# Patient Record
Sex: Female | Born: 1960
Health system: Southern US, Community
[De-identification: ages and names within clinical notes are randomized; demographics above are authoritative.]

## PROBLEM LIST (undated history)

## (undated) DIAGNOSIS — J45909 Unspecified asthma, uncomplicated: Secondary | ICD-10-CM

## (undated) DIAGNOSIS — T8859XA Other complications of anesthesia, initial encounter: Secondary | ICD-10-CM

## (undated) DIAGNOSIS — I10 Essential (primary) hypertension: Secondary | ICD-10-CM

## (undated) DIAGNOSIS — E785 Hyperlipidemia, unspecified: Secondary | ICD-10-CM

## (undated) DIAGNOSIS — E119 Type 2 diabetes mellitus without complications: Secondary | ICD-10-CM

## (undated) DIAGNOSIS — D649 Anemia, unspecified: Secondary | ICD-10-CM

## (undated) HISTORY — PX: CSF SHUNT: SHX92

---

## 2004-01-22 ENCOUNTER — Encounter: Admission: RE | Admit: 2004-01-22 | Discharge: 2004-01-22 | Payer: Self-pay | Admitting: Obstetrics and Gynecology

## 2005-06-19 ENCOUNTER — Encounter: Admission: RE | Admit: 2005-06-19 | Discharge: 2005-06-19 | Payer: Self-pay | Admitting: Obstetrics and Gynecology

## 2006-06-28 ENCOUNTER — Encounter: Admission: RE | Admit: 2006-06-28 | Discharge: 2006-06-28 | Payer: Self-pay | Admitting: Obstetrics and Gynecology

## 2007-07-01 ENCOUNTER — Encounter: Admission: RE | Admit: 2007-07-01 | Discharge: 2007-07-01 | Payer: Self-pay | Admitting: Obstetrics and Gynecology

## 2008-09-05 ENCOUNTER — Emergency Department (HOSPITAL_COMMUNITY): Admission: EM | Admit: 2008-09-05 | Discharge: 2008-09-05 | Payer: Self-pay | Admitting: Emergency Medicine

## 2009-08-08 ENCOUNTER — Emergency Department (HOSPITAL_COMMUNITY): Admission: EM | Admit: 2009-08-08 | Discharge: 2009-08-08 | Payer: Self-pay | Admitting: Family Medicine

## 2011-03-21 LAB — POCT URINALYSIS DIP (DEVICE)
Nitrite: NEGATIVE
Specific Gravity, Urine: 1.025 (ref 1.005–1.030)
pH: 5.5 (ref 5.0–8.0)

## 2011-03-21 LAB — WET PREP, GENITAL: Yeast Wet Prep HPF POC: NONE SEEN

## 2011-06-03 ENCOUNTER — Emergency Department (HOSPITAL_BASED_OUTPATIENT_CLINIC_OR_DEPARTMENT_OTHER)
Admission: EM | Admit: 2011-06-03 | Discharge: 2011-06-03 | Disposition: A | Discharge status: Home / Self Care | Payer: Self-pay | Attending: Emergency Medicine | Admitting: Emergency Medicine

## 2011-06-03 DIAGNOSIS — R062 Wheezing: Secondary | ICD-10-CM | POA: Insufficient documentation

## 2011-06-03 DIAGNOSIS — E785 Hyperlipidemia, unspecified: Secondary | ICD-10-CM | POA: Insufficient documentation

## 2011-06-03 DIAGNOSIS — R059 Cough, unspecified: Secondary | ICD-10-CM | POA: Insufficient documentation

## 2011-06-03 DIAGNOSIS — I10 Essential (primary) hypertension: Secondary | ICD-10-CM | POA: Insufficient documentation

## 2011-06-03 DIAGNOSIS — E119 Type 2 diabetes mellitus without complications: Secondary | ICD-10-CM | POA: Insufficient documentation

## 2011-06-03 DIAGNOSIS — R05 Cough: Secondary | ICD-10-CM | POA: Insufficient documentation

## 2011-09-14 LAB — POCT URINALYSIS DIP (DEVICE)
Bilirubin Urine: NEGATIVE
Hgb urine dipstick: NEGATIVE
Nitrite: NEGATIVE
Protein, ur: NEGATIVE
Urobilinogen, UA: 0.2
pH: 7.5

## 2011-09-14 LAB — POCT PREGNANCY, URINE: Preg Test, Ur: NEGATIVE

## 2013-03-24 ENCOUNTER — Other Ambulatory Visit (HOSPITAL_COMMUNITY)
Admission: RE | Admit: 2013-03-24 | Discharge: 2013-03-24 | Disposition: A | Discharge status: Home / Self Care | Payer: BC Managed Care – PPO | Source: Ambulatory Visit | Attending: Family Medicine | Admitting: Family Medicine

## 2013-03-24 DIAGNOSIS — Z01419 Encounter for gynecological examination (general) (routine) without abnormal findings: Secondary | ICD-10-CM | POA: Insufficient documentation

## 2013-03-24 DIAGNOSIS — Z1151 Encounter for screening for human papillomavirus (HPV): Secondary | ICD-10-CM | POA: Insufficient documentation

## 2013-07-24 ENCOUNTER — Other Ambulatory Visit (HOSPITAL_COMMUNITY)
Admission: RE | Admit: 2013-07-24 | Discharge: 2013-07-24 | Disposition: A | Discharge status: Home / Self Care | Payer: BC Managed Care – PPO | Source: Ambulatory Visit | Attending: Family Medicine | Admitting: Family Medicine

## 2013-07-24 DIAGNOSIS — Z113 Encounter for screening for infections with a predominantly sexual mode of transmission: Secondary | ICD-10-CM | POA: Insufficient documentation

## 2013-07-24 DIAGNOSIS — N760 Acute vaginitis: Secondary | ICD-10-CM | POA: Insufficient documentation

## 2014-07-27 ENCOUNTER — Other Ambulatory Visit (HOSPITAL_COMMUNITY)
Admission: RE | Admit: 2014-07-27 | Discharge: 2014-07-27 | Disposition: A | Discharge status: Home / Self Care | Payer: No Typology Code available for payment source | Source: Ambulatory Visit | Attending: Family Medicine | Admitting: Family Medicine

## 2014-07-27 DIAGNOSIS — Z1151 Encounter for screening for human papillomavirus (HPV): Secondary | ICD-10-CM | POA: Diagnosis present

## 2014-07-27 DIAGNOSIS — Z01419 Encounter for gynecological examination (general) (routine) without abnormal findings: Secondary | ICD-10-CM | POA: Insufficient documentation

## 2016-01-05 ENCOUNTER — Encounter (HOSPITAL_BASED_OUTPATIENT_CLINIC_OR_DEPARTMENT_OTHER): Payer: Self-pay | Admitting: *Deleted

## 2016-01-05 ENCOUNTER — Emergency Department (HOSPITAL_BASED_OUTPATIENT_CLINIC_OR_DEPARTMENT_OTHER)
Admission: EM | Admit: 2016-01-05 | Discharge: 2016-01-05 | Disposition: A | Discharge status: Home / Self Care | Payer: BLUE CROSS/BLUE SHIELD | Attending: Emergency Medicine | Admitting: Emergency Medicine

## 2016-01-05 ENCOUNTER — Emergency Department (HOSPITAL_BASED_OUTPATIENT_CLINIC_OR_DEPARTMENT_OTHER): Payer: BLUE CROSS/BLUE SHIELD

## 2016-01-05 DIAGNOSIS — J069 Acute upper respiratory infection, unspecified: Secondary | ICD-10-CM

## 2016-01-05 DIAGNOSIS — Z880 Allergy status to penicillin: Secondary | ICD-10-CM | POA: Insufficient documentation

## 2016-01-05 DIAGNOSIS — Z7984 Long term (current) use of oral hypoglycemic drugs: Secondary | ICD-10-CM | POA: Insufficient documentation

## 2016-01-05 DIAGNOSIS — E119 Type 2 diabetes mellitus without complications: Secondary | ICD-10-CM | POA: Insufficient documentation

## 2016-01-05 DIAGNOSIS — E785 Hyperlipidemia, unspecified: Secondary | ICD-10-CM | POA: Insufficient documentation

## 2016-01-05 DIAGNOSIS — I10 Essential (primary) hypertension: Secondary | ICD-10-CM | POA: Insufficient documentation

## 2016-01-05 DIAGNOSIS — Z79899 Other long term (current) drug therapy: Secondary | ICD-10-CM | POA: Insufficient documentation

## 2016-01-05 HISTORY — DX: Type 2 diabetes mellitus without complications: E11.9

## 2016-01-05 HISTORY — DX: Hyperlipidemia, unspecified: E78.5

## 2016-01-05 HISTORY — DX: Essential (primary) hypertension: I10

## 2016-01-05 MED ORDER — BENZONATATE 100 MG PO CAPS: 100.0000 mg | ORAL_CAPSULE | Freq: Three times a day (TID) | ORAL | Status: DC | PRN

## 2016-01-05 MED ORDER — BENZONATATE 100 MG PO CAPS
100.0000 mg | ORAL_CAPSULE | Freq: Once | ORAL | Status: AC
  Administered 2016-01-05: 100 mg via ORAL
  Filled 2016-01-05: qty 1

## 2016-01-05 MED ORDER — ALBUTEROL SULFATE HFA 108 (90 BASE) MCG/ACT IN AERS
2.0000 | INHALATION_SPRAY | Freq: Once | RESPIRATORY_TRACT | Status: AC
  Administered 2016-01-05: 2 via RESPIRATORY_TRACT
  Filled 2016-01-05: qty 6.7

## 2016-01-05 NOTE — ED Provider Notes (Signed)
CSN: [id number]     Arrival date & time 01/05/16  1015 History   First MD Initiated Contact with Patient 01/05/16 1032     Chief Complaint  Patient presents with  . Nasal Congestion     (Consider location/radiation/quality/duration/timing/severity/associated sxs/prior Treatment) Patient is a 55 y.o. female presenting with URI.  URI Presenting symptoms: cough (productive yellow) and sore throat   Presenting symptoms: no congestion, no fever and no rhinorrhea   Severity:  Severe Duration:  2 weeks Timing:  Constant Progression:  Worsening Chronicity:  New Relieved by: mucinex, breaking it up, but still tehre. Worsened by:  Nothing tried Ineffective treatments: mucinex. Associated symptoms: wheezing   Associated symptoms: no headaches and no neck pain     Past Medical History  Diagnosis Date  . Hypertension   . Hyperlipidemia   . Diabetes mellitus without complication [facility])    Past Surgical History  Procedure Laterality Date  . Csf shunt      states had shunt for her brain 17 yrs ago   No family history on file. Social History  Substance Use Topics  . Smoking status: Never Smoker   . Smokeless tobacco: None  . Alcohol Use: No   OB History    No data available     Review of Systems  Constitutional: Negative for fever.  HENT: Positive for sore throat. Negative for congestion and rhinorrhea.   Eyes: Negative for visual disturbance.  Respiratory: Positive for cough (productive yellow) and wheezing. Negative for shortness of breath.   Cardiovascular: Negative for chest pain.  Gastrointestinal: Negative for nausea, vomiting and abdominal pain.  Genitourinary: Negative for difficulty urinating.  Musculoskeletal: Negative for back pain and neck pain.  Skin: Negative for rash.  Neurological: Negative for syncope and headaches.      Allergies  Penicillins  Home Medications   Prior to Admission medications   Medication Sig Start Date End Date Taking? Authorizing  Provider  amLODipine (NORVASC) 5 MG tablet Take 5 mg by mouth daily.   Yes Historical Provider, MD  fluvastatin (LESCOL) 20 MG capsule Take 20 mg by mouth at bedtime.   Yes Historical Provider, MD  losartan-hydrochlorothiazide (HYZAAR) 100-12.5 MG tablet Take 1 tablet by mouth daily.   Yes Historical Provider, MD  metFORMIN (GLUCOPHAGE) 500 MG tablet Take by mouth 2 (two) times daily with a meal.   Yes Historical Provider, MD   BP 167/75 mmHg  Pulse 98  Temp(Src) 98.3 F (36.8 C) (Oral)  Resp 20  Ht 5' (1.524 m)  Wt 147 lb (66.679 kg)  BMI 28.71 kg/m2  SpO2 98% Physical Exam  Constitutional: She is oriented to person, place, and time. She appears well-developed and well-nourished. No distress.  HENT:  Head: Normocephalic and atraumatic.  Eyes: Conjunctivae and EOM are normal.  Neck: Normal range of motion.  Cardiovascular: Normal rate, regular rhythm, normal heart sounds and intact distal pulses.  Exam reveals no gallop and no friction rub.   No murmur heard. Pulmonary/Chest: Effort normal and breath sounds normal. No respiratory distress. She has no wheezes. She has no rales.  Diminished throughout  Abdominal: Soft. She exhibits no distension. There is no tenderness. There is no guarding.  Musculoskeletal: She exhibits no edema or tenderness.  Neurological: She is alert and oriented to person, place, and time.  Skin: Skin is warm and dry. No rash noted. She is not diaphoretic. No erythema.  Nursing note and vitals reviewed.   ED Course  Procedures (including critical care time)  Labs Review Labs Reviewed - No data to display  Imaging Review No results found. I have personally reviewed and evaluated these images and lab results as part of my medical decision-making.   EKG Interpretation None      MDM   Final diagnoses:  None   55yo female with history of htn hlpd, DM presents with concern for cough and sore throat. XR shows no sign of pneumonia. No sign  otitis/sinusitis.  Afebrile, well appearing, no shortness of breath.  Patient with peristent cough and some family hx of asthma and will trial albuterol for cough.  Given tessalon perles with improvement. Discharged with rx for tessalon and albuterol inhaler. Patient discharged in stable condition with understanding of reasons to return.     Alvira Monday, MD 01/05/16 2340

## 2016-01-05 NOTE — ED Notes (Signed)
Patient transported to X-ray 

## 2016-01-05 NOTE — Discharge Instructions (Signed)
Use 2 puffs of albuterol every 4 hours as needed for cough.  Upper Respiratory Infection, Adult Most upper respiratory infections (URIs) are a viral infection of the air passages leading to the lungs. A URI affects the nose, throat, and upper air passages. The most common type of URI is nasopharyngitis and is typically referred to as "the common cold." URIs run their course and usually go away on their own. Most of the time, a URI does not require medical attention, but sometimes a bacterial infection in the upper airways can follow a viral infection. This is called a secondary infection. Sinus and middle ear infections are common types of secondary upper respiratory infections. Bacterial pneumonia can also complicate a URI. A URI can worsen asthma and chronic obstructive pulmonary disease (COPD). Sometimes, these complications can require emergency medical care and may be life threatening.  CAUSES Almost all URIs are caused by viruses. A virus is a type of germ and can spread from one person to another.  RISKS FACTORS You may be at risk for a URI if:   You smoke.   You have chronic heart or lung disease.  You have a weakened defense (immune) system.   You are very young or very old.   You have nasal allergies or asthma.  You work in crowded or poorly ventilated areas.  You work in health care facilities or schools. SIGNS AND SYMPTOMS  Symptoms typically develop 2-3 days after you come in contact with a cold virus. Most viral URIs last 7-10 days. However, viral URIs from the influenza virus (flu virus) can last 14-18 days and are typically more severe. Symptoms may include:   Runny or stuffy (congested) nose.   Sneezing.   Cough.   Sore throat.   Headache.   Fatigue.   Fever.   Loss of appetite.   Pain in your forehead, behind your eyes, and over your cheekbones (sinus pain).  Muscle aches.  DIAGNOSIS  Your health care provider may diagnose a URI  by:  Physical exam.  Tests to check that your symptoms are not due to another condition such as:  Strep throat.  Sinusitis.  Pneumonia.  Asthma. TREATMENT  A URI goes away on its own with time. It cannot be cured with medicines, but medicines may be prescribed or recommended to relieve symptoms. Medicines may help:  Reduce your fever.  Reduce your cough.  Relieve nasal congestion. HOME CARE INSTRUCTIONS   Take medicines only as directed by your health care provider.   Gargle warm saltwater or take cough drops to comfort your throat as directed by your health care provider.  Use a warm mist humidifier or inhale steam from a shower to increase air moisture. This may make it easier to breathe.  Drink enough fluid to keep your urine clear or pale yellow.   Eat soups and other clear broths and maintain good nutrition.   Rest as needed.   Return to work when your temperature has returned to normal or as your health care provider advises. You may need to stay home longer to avoid infecting others. You can also use a face mask and careful hand washing to prevent spread of the virus.  Increase the usage of your inhaler if you have asthma.   Do not use any tobacco products, including cigarettes, chewing tobacco, or electronic cigarettes. If you need help quitting, ask your health care provider. PREVENTION  The best way to protect yourself from getting a cold is to practice  good hygiene.   Avoid oral or hand contact with people with cold symptoms.   Wash your hands often if contact occurs.  There is no clear evidence that vitamin C, vitamin E, echinacea, or exercise reduces the chance of developing a cold. However, it is always recommended to get plenty of rest, exercise, and practice good nutrition.  SEEK MEDICAL CARE IF:   You are getting worse rather than better.   Your symptoms are not controlled by medicine.   You have chills.  You have worsening shortness of  breath.  You have brown or red mucus.  You have yellow or brown nasal discharge.  You have pain in your face, especially when you bend forward.  You have a fever.  You have swollen neck glands.  You have pain while swallowing.  You have white areas in the back of your throat. SEEK IMMEDIATE MEDICAL CARE IF:   You have severe or persistent:  Headache.  Ear pain.  Sinus pain.  Chest pain.  You have chronic lung disease and any of the following:  Wheezing.  Prolonged cough.  Coughing up blood.  A change in your usual mucus.  You have a stiff neck.  You have changes in your:  Vision.  Hearing.  Thinking.  Mood. MAKE SURE YOU:   Understand these instructions.  Will watch your condition.  Will get help right away if you are not doing well or get worse.   This information is not intended to replace advice given to you by your health care provider. Make sure you discuss any questions you have with your health care provider.   Document Released: 05/26/2001 Document Revised: 04/16/2015 Document Reviewed: 03/07/2014 Elsevier Interactive Patient Education [employer]

## 2016-01-05 NOTE — ED Notes (Signed)
DC instructions and Rx reviewed with pt, MD also at bedside to discuss and provide teaching re: following up with MD, need to return to ED and increasing PO fluids.

## 2016-01-05 NOTE — ED Notes (Signed)
Sates for the past couple of weeks has had cough and congestion, has tried OTC meds without relief

## 2016-03-09 ENCOUNTER — Emergency Department (HOSPITAL_BASED_OUTPATIENT_CLINIC_OR_DEPARTMENT_OTHER): Payer: BLUE CROSS/BLUE SHIELD

## 2016-03-09 ENCOUNTER — Encounter (HOSPITAL_BASED_OUTPATIENT_CLINIC_OR_DEPARTMENT_OTHER): Payer: Self-pay | Admitting: Emergency Medicine

## 2016-03-09 ENCOUNTER — Emergency Department (HOSPITAL_BASED_OUTPATIENT_CLINIC_OR_DEPARTMENT_OTHER)
Admission: EM | Admit: 2016-03-09 | Discharge: 2016-03-09 | Disposition: A | Discharge status: Home / Self Care | Payer: BLUE CROSS/BLUE SHIELD | Attending: Emergency Medicine | Admitting: Emergency Medicine

## 2016-03-09 DIAGNOSIS — R0981 Nasal congestion: Secondary | ICD-10-CM | POA: Insufficient documentation

## 2016-03-09 DIAGNOSIS — R067 Sneezing: Secondary | ICD-10-CM | POA: Diagnosis not present

## 2016-03-09 DIAGNOSIS — I10 Essential (primary) hypertension: Secondary | ICD-10-CM | POA: Diagnosis not present

## 2016-03-09 DIAGNOSIS — J3489 Other specified disorders of nose and nasal sinuses: Secondary | ICD-10-CM | POA: Insufficient documentation

## 2016-03-09 DIAGNOSIS — Z7984 Long term (current) use of oral hypoglycemic drugs: Secondary | ICD-10-CM | POA: Diagnosis not present

## 2016-03-09 DIAGNOSIS — R05 Cough: Secondary | ICD-10-CM | POA: Insufficient documentation

## 2016-03-09 DIAGNOSIS — Z880 Allergy status to penicillin: Secondary | ICD-10-CM | POA: Insufficient documentation

## 2016-03-09 DIAGNOSIS — R0602 Shortness of breath: Secondary | ICD-10-CM | POA: Diagnosis not present

## 2016-03-09 DIAGNOSIS — Z79899 Other long term (current) drug therapy: Secondary | ICD-10-CM | POA: Insufficient documentation

## 2016-03-09 DIAGNOSIS — E785 Hyperlipidemia, unspecified: Secondary | ICD-10-CM | POA: Diagnosis not present

## 2016-03-09 DIAGNOSIS — R062 Wheezing: Secondary | ICD-10-CM | POA: Diagnosis present

## 2016-03-09 DIAGNOSIS — E119 Type 2 diabetes mellitus without complications: Secondary | ICD-10-CM | POA: Diagnosis not present

## 2016-03-09 MED ORDER — CETIRIZINE-PSEUDOEPHEDRINE ER 5-120 MG PO TB12: 1.0000 | ORAL_TABLET | Freq: Every day | ORAL | Status: DC

## 2016-03-09 MED ORDER — HYDROCODONE-HOMATROPINE 5-1.5 MG/5ML PO SYRP: 5.0000 mL | ORAL_SOLUTION | Freq: Four times a day (QID) | ORAL | Status: DC | PRN

## 2016-03-09 MED ORDER — IPRATROPIUM-ALBUTEROL 0.5-2.5 (3) MG/3ML IN SOLN
3.0000 mL | RESPIRATORY_TRACT | Status: DC
  Administered 2016-03-09: 3 mL via RESPIRATORY_TRACT
  Filled 2016-03-09: qty 3

## 2016-03-09 MED ORDER — PREDNISONE 50 MG PO TABS
60.0000 mg | ORAL_TABLET | Freq: Once | ORAL | Status: AC
  Administered 2016-03-09: 60 mg via ORAL
  Filled 2016-03-09: qty 1

## 2016-03-09 MED ORDER — PREDNISONE 20 MG PO TABS: 40.0000 mg | ORAL_TABLET | Freq: Every day | ORAL | Status: DC

## 2016-03-09 MED ORDER — AZITHROMYCIN 250 MG PO TABS
500.0000 mg | ORAL_TABLET | Freq: Once | ORAL | Status: AC
  Administered 2016-03-09: 500 mg via ORAL
  Filled 2016-03-09: qty 2

## 2016-03-09 MED ORDER — AZITHROMYCIN 250 MG PO TABS: ORAL_TABLET | ORAL | Status: DC

## 2016-03-09 MED FILL — AZITHROMYCIN 250 MG TABLET: 250 | 4 days supply | Qty: 4 | Fill #0

## 2016-03-09 MED FILL — HYDROCODONE-HOMATROPINE SYR: 5-1.5 | 6 days supply | Qty: 120 | Fill #0

## 2016-03-09 MED FILL — predniSONE 20 MG TABS: 20 | 4 days supply | Qty: 8 | Fill #0

## 2016-03-09 NOTE — ED Notes (Addendum)
Pt seen in January for uri.  Pt states she is still having chest congestion, wheezing but no productive cough.  No fever.  Pt feel sob with supine position but no peripheral edema.

## 2016-03-09 NOTE — ED Provider Notes (Signed)
CSN: [id number]     Arrival date & time 03/09/16  9147 History   First MD Initiated Contact with Patient 03/09/16 [phone]     Chief Complaint  Patient presents with  . Wheezing     (Consider location/radiation/quality/duration/timing/severity/associated sxs/prior Treatment) Patient is a 55 y.o. female presenting with cough.  Cough Cough characteristics:  Productive Sputum characteristics:  Nondescript Severity:  Mild Timing:  Constant Progression:  Waxing and waning Chronicity:  New Smoker: no   Associated symptoms: rhinorrhea, shortness of breath and wheezing   Associated symptoms: no chest pain and no fever     Past Medical History  Diagnosis Date  . Hypertension   . Hyperlipidemia   . Diabetes mellitus without complication [facility])    Past Surgical History  Procedure Laterality Date  . Csf shunt      states had shunt for her brain 17 yrs ago   No family history on file. Social History  Substance Use Topics  . Smoking status: Never Smoker   . Smokeless tobacco: None  . Alcohol Use: No   OB History    No data available     Review of Systems  Constitutional: Negative for fever and fatigue.  HENT: Positive for congestion, rhinorrhea and sneezing. Negative for facial swelling, mouth sores, sinus pressure and trouble swallowing.   Eyes: Negative for pain.  Respiratory: Positive for cough, shortness of breath and wheezing.   Cardiovascular: Negative for chest pain, palpitations and leg swelling.  Endocrine: Negative for polydipsia and polyuria.  All other systems reviewed and are negative.     Allergies  Penicillins  Home Medications   Prior to Admission medications   Medication Sig Start Date End Date Taking? Authorizing Provider  amLODipine (NORVASC) 5 MG tablet Take 5 mg by mouth daily.    Historical Provider, MD  azithromycin (ZITHROMAX) 250 MG tablet One daily for four days 03/10/16   Marily Memos, MD  benzonatate (TESSALON) 100 MG capsule Take 1 capsule  (100 mg total) by mouth 3 (three) times daily as needed for cough. 01/05/16   Alvira Monday, MD  cetirizine-pseudoephedrine (ZYRTEC-D) 5-120 MG tablet Take 1 tablet by mouth daily. 03/09/16   Marily Memos, MD  fluvastatin (LESCOL) 20 MG capsule Take 20 mg by mouth at bedtime.    Historical Provider, MD  HYDROcodone-homatropine (HYCODAN) 5-1.5 MG/5ML syrup Take 5 mLs by mouth every 6 (six) hours as needed for cough. 03/09/16   Marily Memos, MD  losartan-hydrochlorothiazide (HYZAAR) 100-12.5 MG tablet Take 1 tablet by mouth daily.    Historical Provider, MD  metFORMIN (GLUCOPHAGE) 500 MG tablet Take by mouth 2 (two) times daily with a meal.    Historical Provider, MD  predniSONE (DELTASONE) 20 MG tablet Take 2 tablets (40 mg total) by mouth daily with breakfast. 03/10/16   Marily Memos, MD   BP 152/72 mmHg  Pulse 88  Temp(Src) 98.9 F (37.2 C) (Oral)  Resp 16  Ht 5' (1.524 m)  Wt 140 lb (63.504 kg)  BMI 27.34 kg/m2  SpO2 100%  LMP  (LMP Unknown) Physical Exam  Constitutional: She is oriented to person, place, and time. She appears well-developed and well-nourished.  HENT:  Head: Normocephalic and atraumatic.  Neck: Normal range of motion.  Cardiovascular: Normal rate and regular rhythm.   Pulmonary/Chest: Effort normal. No stridor. No respiratory distress. She has wheezes.  Abdominal: Soft. She exhibits no distension. There is no tenderness.  Musculoskeletal: Normal range of motion. She exhibits no edema or tenderness.  Neurological:  She is alert and oriented to person, place, and time.  Skin: Skin is warm and dry. No rash noted.  Nursing note and vitals reviewed.   ED Course  Procedures (including critical care time) Labs Review Labs Reviewed - No data to display  Imaging Review Dg Chest 2 View  03/09/2016  CLINICAL DATA:  Shortness of breath and wheezing EXAM: CHEST  2 VIEW COMPARISON:  January 05, 2016 FINDINGS: Lungs are clear. Heart size and pulmonary vascularity are normal.  No adenopathy. There is a shunt along the anterior right hemithorax. There is degenerative change in the thoracic spine. IMPRESSION: No edema or consolidation. Electronically Signed   By: Bretta BangWilliam  Woodruff III M.D.   On: 03/09/2016 10:43   I have personally reviewed and evaluated these images and lab results as part of my medical decision-making.   EKG Interpretation   Date/Time:  Monday March 09 2016 09:51:24 EDT Ventricular Rate:  79 PR Interval:  200 QRS Duration: 74 QT Interval:  370 QTC Calculation: 424 R Axis:   41 Text Interpretation:  Normal sinus rhythm Nonspecific T wave abnormality  Abnormal ECG Confirmed by Zyra Parrillo MD, Barbara CowerJASON [phone]([id number]) on 03/09/2016 10:43:16  AM      MDM   Final diagnoses:  Wheezing   Likely recurrent URI, improved with albuterol. Has h/o DM, HTN, HLD so will check CXR and ECG as screening cardiac abnormalities/pneumonia. If negative will dc on abx, steroids and inhaler.   Labs and imaging unremarkable. Improved symptoms. Will dc on abx/steroids/inhaler.   New Prescriptions: Discharge Medication List as of 03/09/2016 10:48 AM    START taking these medications   Details  azithromycin (ZITHROMAX) 250 MG tablet One daily for four days, Print    cetirizine-pseudoephedrine (ZYRTEC-D) 5-120 MG tablet Take 1 tablet by mouth daily., Starting 03/09/2016, Until Discontinued, Print    HYDROcodone-homatropine (HYCODAN) 5-1.5 MG/5ML syrup Take 5 mLs by mouth every 6 (six) hours as needed for cough., Starting 03/09/2016, Until Discontinued, Print    predniSONE (DELTASONE) 20 MG tablet Take 2 tablets (40 mg total) by mouth daily with breakfast., Starting 03/10/2016, Until Discontinued, Print         I have personally and contemperaneously reviewed labs and imaging and used in my decision making as above.   A medical screening exam was performed and I feel the patient has had an appropriate workup for their chief complaint at this time and likelihood of emergent  condition existing is low. Their vital signs are stable. They have been counseled on decision, discharge, follow up and which symptoms necessitate immediate return to the emergency department.  They verbally stated understanding and agreement with plan and discharged in stable condition.      Marily MemosJason Kamron Vanwyhe, MD 03/10/16 [phone]

## 2016-03-09 NOTE — ED Notes (Signed)
MD at bedside. 

## 2016-03-09 NOTE — ED Notes (Signed)
Pt directed to pharmacy to pick medications

## 2016-04-13 DIAGNOSIS — I10 Essential (primary) hypertension: Secondary | ICD-10-CM | POA: Diagnosis not present

## 2016-04-13 DIAGNOSIS — E119 Type 2 diabetes mellitus without complications: Secondary | ICD-10-CM | POA: Diagnosis not present

## 2016-04-13 DIAGNOSIS — J309 Allergic rhinitis, unspecified: Secondary | ICD-10-CM | POA: Diagnosis not present

## 2016-07-13 DIAGNOSIS — E119 Type 2 diabetes mellitus without complications: Secondary | ICD-10-CM | POA: Diagnosis not present

## 2016-07-13 DIAGNOSIS — I10 Essential (primary) hypertension: Secondary | ICD-10-CM | POA: Diagnosis not present

## 2016-07-13 DIAGNOSIS — Z6825 Body mass index (BMI) 25.0-25.9, adult: Secondary | ICD-10-CM | POA: Diagnosis not present

## 2016-07-13 DIAGNOSIS — E785 Hyperlipidemia, unspecified: Secondary | ICD-10-CM | POA: Diagnosis not present

## 2016-07-30 DIAGNOSIS — H538 Other visual disturbances: Secondary | ICD-10-CM | POA: Diagnosis not present

## 2016-07-30 DIAGNOSIS — H20011 Primary iridocyclitis, right eye: Secondary | ICD-10-CM | POA: Diagnosis not present

## 2016-07-30 DIAGNOSIS — H35033 Hypertensive retinopathy, bilateral: Secondary | ICD-10-CM | POA: Diagnosis not present

## 2016-10-12 DIAGNOSIS — M25562 Pain in left knee: Secondary | ICD-10-CM | POA: Diagnosis not present

## 2016-10-12 DIAGNOSIS — E785 Hyperlipidemia, unspecified: Secondary | ICD-10-CM | POA: Diagnosis not present

## 2016-10-12 DIAGNOSIS — E118 Type 2 diabetes mellitus with unspecified complications: Secondary | ICD-10-CM | POA: Diagnosis not present

## 2016-11-11 DIAGNOSIS — Z6826 Body mass index (BMI) 26.0-26.9, adult: Secondary | ICD-10-CM | POA: Diagnosis not present

## 2016-11-11 DIAGNOSIS — M659 Synovitis and tenosynovitis, unspecified: Secondary | ICD-10-CM | POA: Diagnosis not present

## 2017-01-11 DIAGNOSIS — I10 Essential (primary) hypertension: Secondary | ICD-10-CM | POA: Diagnosis not present

## 2017-01-11 DIAGNOSIS — E118 Type 2 diabetes mellitus with unspecified complications: Secondary | ICD-10-CM | POA: Diagnosis not present

## 2017-01-11 DIAGNOSIS — M25569 Pain in unspecified knee: Secondary | ICD-10-CM | POA: Diagnosis not present

## 2017-02-08 DIAGNOSIS — Z803 Family history of malignant neoplasm of breast: Secondary | ICD-10-CM | POA: Diagnosis not present

## 2017-02-08 DIAGNOSIS — Z1231 Encounter for screening mammogram for malignant neoplasm of breast: Secondary | ICD-10-CM | POA: Diagnosis not present

## 2017-03-03 DIAGNOSIS — Z6826 Body mass index (BMI) 26.0-26.9, adult: Secondary | ICD-10-CM | POA: Diagnosis not present

## 2017-03-03 DIAGNOSIS — M659 Synovitis and tenosynovitis, unspecified: Secondary | ICD-10-CM | POA: Diagnosis not present

## 2017-03-03 DIAGNOSIS — M5136 Other intervertebral disc degeneration, lumbar region: Secondary | ICD-10-CM | POA: Diagnosis not present

## 2017-04-12 DIAGNOSIS — Z6826 Body mass index (BMI) 26.0-26.9, adult: Secondary | ICD-10-CM | POA: Diagnosis not present

## 2017-04-12 DIAGNOSIS — M659 Synovitis and tenosynovitis, unspecified: Secondary | ICD-10-CM | POA: Diagnosis not present

## 2017-04-14 ENCOUNTER — Other Ambulatory Visit: Payer: Self-pay | Admitting: Family Medicine

## 2017-04-14 ENCOUNTER — Other Ambulatory Visit (HOSPITAL_COMMUNITY)
Admission: RE | Admit: 2017-04-14 | Discharge: 2017-04-14 | Disposition: A | Discharge status: Home / Self Care | Payer: BLUE CROSS/BLUE SHIELD | Source: Ambulatory Visit | Attending: Family Medicine | Admitting: Family Medicine

## 2017-04-14 DIAGNOSIS — Z0001 Encounter for general adult medical examination with abnormal findings: Secondary | ICD-10-CM | POA: Diagnosis not present

## 2017-04-14 DIAGNOSIS — Z113 Encounter for screening for infections with a predominantly sexual mode of transmission: Secondary | ICD-10-CM | POA: Diagnosis not present

## 2017-04-14 DIAGNOSIS — Z01411 Encounter for gynecological examination (general) (routine) with abnormal findings: Secondary | ICD-10-CM | POA: Diagnosis not present

## 2017-04-14 DIAGNOSIS — R87810 Cervical high risk human papillomavirus (HPV) DNA test positive: Secondary | ICD-10-CM | POA: Diagnosis not present

## 2017-04-14 DIAGNOSIS — E118 Type 2 diabetes mellitus with unspecified complications: Secondary | ICD-10-CM | POA: Diagnosis not present

## 2017-04-14 DIAGNOSIS — A5901 Trichomonal vulvovaginitis: Secondary | ICD-10-CM | POA: Diagnosis not present

## 2017-04-14 DIAGNOSIS — I10 Essential (primary) hypertension: Secondary | ICD-10-CM | POA: Diagnosis not present

## 2017-04-14 DIAGNOSIS — Z1151 Encounter for screening for human papillomavirus (HPV): Secondary | ICD-10-CM | POA: Diagnosis not present

## 2017-04-19 LAB — URINE CYTOLOGY ANCILLARY ONLY
Bacterial vaginitis: POSITIVE — AB
CHLAMYDIA, DNA PROBE: NEGATIVE
Candida vaginitis: NEGATIVE
NEISSERIA GONORRHEA: NEGATIVE
Trichomonas: NEGATIVE

## 2017-04-20 LAB — CYTOLOGY - PAP
BACTERIAL VAGINITIS: NEGATIVE
Candida vaginitis: NEGATIVE
Chlamydia: NEGATIVE
DIAGNOSIS: NEGATIVE
HERPES (WINDOWPATH): NEGATIVE
HPV (WINDOPATH): DETECTED — AB
HPV 16/18/45 GENOTYPING: NEGATIVE
NEISSERIA GONORRHEA: NEGATIVE
Trichomonas: NEGATIVE

## 2017-05-11 DIAGNOSIS — B373 Candidiasis of vulva and vagina: Secondary | ICD-10-CM | POA: Diagnosis not present

## 2017-05-11 DIAGNOSIS — N3090 Cystitis, unspecified without hematuria: Secondary | ICD-10-CM | POA: Diagnosis not present

## 2017-05-11 DIAGNOSIS — N9411 Superficial (introital) dyspareunia: Secondary | ICD-10-CM | POA: Diagnosis not present

## 2017-05-11 DIAGNOSIS — R300 Dysuria: Secondary | ICD-10-CM | POA: Diagnosis not present

## 2017-06-01 DIAGNOSIS — N905 Atrophy of vulva: Secondary | ICD-10-CM | POA: Diagnosis not present

## 2017-06-01 DIAGNOSIS — N952 Postmenopausal atrophic vaginitis: Secondary | ICD-10-CM | POA: Diagnosis not present

## 2017-06-01 DIAGNOSIS — R350 Frequency of micturition: Secondary | ICD-10-CM | POA: Diagnosis not present

## 2017-06-27 ENCOUNTER — Emergency Department (HOSPITAL_BASED_OUTPATIENT_CLINIC_OR_DEPARTMENT_OTHER)
Admission: EM | Admit: 2017-06-27 | Discharge: 2017-06-27 | Disposition: A | Discharge status: Home / Self Care | Payer: BLUE CROSS/BLUE SHIELD | Attending: Emergency Medicine | Admitting: Emergency Medicine

## 2017-06-27 ENCOUNTER — Encounter (HOSPITAL_BASED_OUTPATIENT_CLINIC_OR_DEPARTMENT_OTHER): Payer: Self-pay | Admitting: *Deleted

## 2017-06-27 DIAGNOSIS — H4010X0 Unspecified open-angle glaucoma, stage unspecified: Secondary | ICD-10-CM | POA: Diagnosis not present

## 2017-06-27 DIAGNOSIS — H578 Other specified disorders of eye and adnexa: Secondary | ICD-10-CM | POA: Diagnosis not present

## 2017-06-27 DIAGNOSIS — Z79899 Other long term (current) drug therapy: Secondary | ICD-10-CM | POA: Insufficient documentation

## 2017-06-27 DIAGNOSIS — E119 Type 2 diabetes mellitus without complications: Secondary | ICD-10-CM | POA: Insufficient documentation

## 2017-06-27 DIAGNOSIS — H40211 Acute angle-closure glaucoma, right eye: Secondary | ICD-10-CM | POA: Diagnosis not present

## 2017-06-27 DIAGNOSIS — I10 Essential (primary) hypertension: Secondary | ICD-10-CM | POA: Diagnosis not present

## 2017-06-27 DIAGNOSIS — H409 Unspecified glaucoma: Secondary | ICD-10-CM | POA: Diagnosis not present

## 2017-06-27 MED ORDER — DORZOLAMIDE HCL-TIMOLOL MAL 2-0.5 % OP SOLN: 1.0000 [drp] | Freq: Two times a day (BID) | OPHTHALMIC | 12 refills | Status: DC

## 2017-06-27 MED ORDER — TETRACAINE HCL 0.5 % OP SOLN
2.0000 [drp] | Freq: Once | OPHTHALMIC | Status: AC
  Administered 2017-06-27: 2 [drp] via OPHTHALMIC
  Filled 2017-06-27: qty 4

## 2017-06-27 MED ORDER — BRIMONIDINE TARTRATE 0.15 % OP SOLN: 1.0000 [drp] | Freq: Three times a day (TID) | OPHTHALMIC | 12 refills | Status: DC

## 2017-06-27 MED ORDER — FLUORESCEIN SODIUM 0.6 MG OP STRP
1.0000 | ORAL_STRIP | Freq: Once | OPHTHALMIC | Status: AC
  Administered 2017-06-27: 1 via OPHTHALMIC
  Filled 2017-06-27: qty 1

## 2017-06-27 NOTE — ED Triage Notes (Signed)
Patient states she has a two day history of right eye drainage, pain and pressure.

## 2017-06-27 NOTE — ED Provider Notes (Signed)
[facility]   CSN: [id number] Arrival date & time: 06/27/17  1051     History   Chief Complaint Chief Complaint  Patient presents with  . Eye Drainage    right    HPI Cheryl Huang is a 56 y.o. female.  Patient is a 56 year old female with a history of diabetes and hypertension who presents with right eye pain. She noted that it started 2 days ago with blurry vision in the eye. Since that time she's had progressive watery drainage and burning with pressure feeling in the eye. She says her vision is still very blurry. She had an associated headache yesterday but none today. She denies any rashes around the eye. No URI symptoms. No crusting of the eyelids. She does not wear contacts. She denies any eye exposures or injuries.      Past Medical History:  Diagnosis Date  . Diabetes mellitus without complication (HCC)   . Hyperlipidemia   . Hypertension     There are no active problems to display for this patient.   Past Surgical History:  Procedure Laterality Date  . CSF SHUNT     states had shunt for her brain 17 yrs ago    OB History    No data available       Home Medications    Prior to Admission medications   Medication Sig Start Date End Date Taking? Authorizing Provider  amLODipine (NORVASC) 5 MG tablet Take 5 mg by mouth daily.   Yes [provider]  fluvastatin (LESCOL) 20 MG capsule Take 20 mg by mouth at bedtime.   Yes [provider]  losartan-hydrochlorothiazide (HYZAAR) 100-12.5 MG tablet Take 1 tablet by mouth daily.   Yes [provider]  metFORMIN (GLUCOPHAGE) 500 MG tablet Take by mouth 2 (two) times daily with a meal.   Yes [provider]  azithromycin (ZITHROMAX) 250 MG tablet One daily for four days 03/10/16   Mesner, Barbara CowerJason, MD  benzonatate (TESSALON) 100 MG capsule Take 1 capsule (100 mg total) by mouth 3 (three) times daily as needed for cough. 01/05/16   Alvira MondaySchlossman, Erin, MD    brimonidine (ALPHAGAN P) 0.15 % ophthalmic solution Place 1 drop into the right eye 3 (three) times daily. 06/27/17   Rolan BuccoBelfi, Grahm Etsitty, MD  cetirizine-pseudoephedrine (ZYRTEC-D) 5-120 MG tablet Take 1 tablet by mouth daily. 03/09/16   Mesner, Barbara CowerJason, MD  dorzolamide-timolol (COSOPT) 22.3-6.8 MG/ML ophthalmic solution Place 1 drop into the right eye 2 (two) times daily. 06/27/17   Rolan BuccoBelfi, Marisol Giambra, MD  HYDROcodone-homatropine (HYCODAN) 5-1.5 MG/5ML syrup Take 5 mLs by mouth every 6 (six) hours as needed for cough. 03/09/16   Mesner, Barbara CowerJason, MD  predniSONE (DELTASONE) 20 MG tablet Take 2 tablets (40 mg total) by mouth daily with breakfast. 03/10/16   Mesner, Barbara CowerJason, MD    Family History No family history on file.  Social History Social History  Substance Use Topics  . Smoking status: Never Smoker  . Smokeless tobacco: Never Used  . Alcohol use No     Allergies   Penicillins   Review of Systems Review of Systems  Constitutional: Negative for fever.  HENT: Negative for congestion, postnasal drip, rhinorrhea and sinus pain.   Eyes: Positive for photophobia, pain, discharge, redness and visual disturbance.  Respiratory: Negative for cough and shortness of breath.   Gastrointestinal: Negative for nausea and vomiting.  Musculoskeletal: Negative for neck pain.  Skin: Negative for rash.  Neurological: Positive for headaches.  All  other systems reviewed and are negative.    Physical Exam Updated Vital Signs BP (!) 164/72 (BP Location: Right Arm)   Pulse 91   Temp 98.3 F (36.8 C) (Oral)   Resp 20   Ht 5' (1.524 m)   Wt 64.9 kg (143 lb)   LMP  (LMP Unknown)   SpO2 100%   BMI 27.93 kg/m   Physical Exam  Constitutional: She is oriented to person, place, and time. She appears well-developed and well-nourished.  HENT:  Head: Normocephalic and atraumatic.  Eyes: EOM are normal. Right eye exhibits discharge.  Patient has conjunctival injection with erythema of the right eye. There is  watery discharge. No crusting. No fluoroscein uptake. No surrounding rashes. The right pupil is 4 mm, the left pupil is 2 mm. Right pupil is sluggish to light reactivity. Tono-Pen measurements to the right eye are between 39 and 42. Left eye pressures are 24  Neck: Normal range of motion. Neck supple.  Cardiovascular: Normal rate.   Pulmonary/Chest: Effort normal.  Neurological: She is alert and oriented to person, place, and time.  Skin: Skin is warm and dry.  Psychiatric: She has a normal mood and affect.     ED Treatments / Results  Labs (all labs ordered are listed, but only abnormal results are displayed) Labs Reviewed - No data to display  EKG  EKG Interpretation None       Radiology No results found.  Procedures Procedures (including critical care time)  Medications Ordered in ED Medications  tetracaine (PONTOCAINE) 0.5 % ophthalmic solution 2 drop (2 drops Right Eye Given by Other 06/27/17 1111)  fluorescein ophthalmic strip 1 strip (1 strip Right Eye Given by Other 06/27/17 1111)     Initial Impression / Assessment and Plan / ED Course  I have reviewed the triage vital signs and the nursing notes.  Pertinent labs & imaging results that were available during my care of the patient were reviewed by me and considered in my medical decision making (see chart for details).     Patient presents with right eye pain and redness. She has increased pressures of the eye. She has a dilated pupil in the eye and vision loss with 20/200 vision in that eye. I spoke with Dr. Dione Booze with ophthalmology who will see the patient for an eye exam today in his office. He has given me his cell phone number for the patient to call to arrange for a time to meet him over there today. He wanted me to go ahead and give the patient prescriptions for medications for presumed glaucoma. The patient will not fill these prescriptions until she is evaluated by Dr. Dione Booze. Patient is aware these  instructions and amenable to meeting Dr. Dione Booze today.  Final Clinical Impressions(s) / ED Diagnoses   Final diagnoses:  Open-angle glaucoma of right eye, unspecified glaucoma stage, unspecified open-angle glaucoma type    New Prescriptions New Prescriptions   BRIMONIDINE (ALPHAGAN P) 0.15 % OPHTHALMIC SOLUTION    Place 1 drop into the right eye 3 (three) times daily.   DORZOLAMIDE-TIMOLOL (COSOPT) 22.3-6.8 MG/ML OPHTHALMIC SOLUTION    Place 1 drop into the right eye 2 (two) times daily.     Rolan Bucco, MD 06/27/17 1214

## 2017-06-28 DIAGNOSIS — E119 Type 2 diabetes mellitus without complications: Secondary | ICD-10-CM | POA: Diagnosis not present

## 2017-06-28 DIAGNOSIS — H20011 Primary iridocyclitis, right eye: Secondary | ICD-10-CM | POA: Diagnosis not present

## 2017-07-06 DIAGNOSIS — E119 Type 2 diabetes mellitus without complications: Secondary | ICD-10-CM | POA: Diagnosis not present

## 2017-07-06 DIAGNOSIS — H20011 Primary iridocyclitis, right eye: Secondary | ICD-10-CM | POA: Diagnosis not present

## 2017-07-13 DIAGNOSIS — H20011 Primary iridocyclitis, right eye: Secondary | ICD-10-CM | POA: Diagnosis not present

## 2017-07-13 DIAGNOSIS — E119 Type 2 diabetes mellitus without complications: Secondary | ICD-10-CM | POA: Diagnosis not present

## 2017-08-10 DIAGNOSIS — E119 Type 2 diabetes mellitus without complications: Secondary | ICD-10-CM | POA: Diagnosis not present

## 2017-08-10 DIAGNOSIS — H20011 Primary iridocyclitis, right eye: Secondary | ICD-10-CM | POA: Diagnosis not present

## 2017-08-18 DIAGNOSIS — I10 Essential (primary) hypertension: Secondary | ICD-10-CM | POA: Diagnosis not present

## 2017-08-18 DIAGNOSIS — E785 Hyperlipidemia, unspecified: Secondary | ICD-10-CM | POA: Diagnosis not present

## 2017-08-18 DIAGNOSIS — E118 Type 2 diabetes mellitus with unspecified complications: Secondary | ICD-10-CM | POA: Diagnosis not present

## 2017-08-18 DIAGNOSIS — Z6826 Body mass index (BMI) 26.0-26.9, adult: Secondary | ICD-10-CM | POA: Diagnosis not present

## 2017-09-07 DIAGNOSIS — E119 Type 2 diabetes mellitus without complications: Secondary | ICD-10-CM | POA: Diagnosis not present

## 2017-09-07 DIAGNOSIS — H20011 Primary iridocyclitis, right eye: Secondary | ICD-10-CM | POA: Diagnosis not present

## 2017-10-05 DIAGNOSIS — E119 Type 2 diabetes mellitus without complications: Secondary | ICD-10-CM | POA: Diagnosis not present

## 2017-10-05 DIAGNOSIS — H20011 Primary iridocyclitis, right eye: Secondary | ICD-10-CM | POA: Diagnosis not present

## 2017-12-01 DIAGNOSIS — R05 Cough: Secondary | ICD-10-CM | POA: Diagnosis not present

## 2017-12-01 DIAGNOSIS — I10 Essential (primary) hypertension: Secondary | ICD-10-CM | POA: Diagnosis not present

## 2017-12-01 DIAGNOSIS — E118 Type 2 diabetes mellitus with unspecified complications: Secondary | ICD-10-CM | POA: Diagnosis not present

## 2017-12-01 DIAGNOSIS — E785 Hyperlipidemia, unspecified: Secondary | ICD-10-CM | POA: Diagnosis not present

## 2018-03-16 DIAGNOSIS — M25561 Pain in right knee: Secondary | ICD-10-CM | POA: Diagnosis not present

## 2018-03-16 DIAGNOSIS — M25562 Pain in left knee: Secondary | ICD-10-CM | POA: Diagnosis not present

## 2018-04-04 DIAGNOSIS — E785 Hyperlipidemia, unspecified: Secondary | ICD-10-CM | POA: Diagnosis not present

## 2018-04-04 DIAGNOSIS — I10 Essential (primary) hypertension: Secondary | ICD-10-CM | POA: Diagnosis not present

## 2018-04-04 DIAGNOSIS — E118 Type 2 diabetes mellitus with unspecified complications: Secondary | ICD-10-CM | POA: Diagnosis not present

## 2018-04-12 DIAGNOSIS — Z803 Family history of malignant neoplasm of breast: Secondary | ICD-10-CM | POA: Diagnosis not present

## 2018-04-12 DIAGNOSIS — Z1231 Encounter for screening mammogram for malignant neoplasm of breast: Secondary | ICD-10-CM | POA: Diagnosis not present

## 2018-05-17 DIAGNOSIS — N760 Acute vaginitis: Secondary | ICD-10-CM | POA: Diagnosis not present

## 2018-05-17 DIAGNOSIS — Z01419 Encounter for gynecological examination (general) (routine) without abnormal findings: Secondary | ICD-10-CM | POA: Diagnosis not present

## 2018-05-17 DIAGNOSIS — Z1151 Encounter for screening for human papillomavirus (HPV): Secondary | ICD-10-CM | POA: Diagnosis not present

## 2018-05-17 DIAGNOSIS — Z124 Encounter for screening for malignant neoplasm of cervix: Secondary | ICD-10-CM | POA: Diagnosis not present

## 2018-05-17 DIAGNOSIS — B9689 Other specified bacterial agents as the cause of diseases classified elsewhere: Secondary | ICD-10-CM | POA: Diagnosis not present

## 2018-06-30 DIAGNOSIS — R87810 Cervical high risk human papillomavirus (HPV) DNA test positive: Secondary | ICD-10-CM | POA: Insufficient documentation

## 2018-08-08 DIAGNOSIS — I10 Essential (primary) hypertension: Secondary | ICD-10-CM | POA: Diagnosis not present

## 2018-08-08 DIAGNOSIS — Z6826 Body mass index (BMI) 26.0-26.9, adult: Secondary | ICD-10-CM | POA: Diagnosis not present

## 2018-08-08 DIAGNOSIS — E118 Type 2 diabetes mellitus with unspecified complications: Secondary | ICD-10-CM | POA: Diagnosis not present

## 2018-09-05 DIAGNOSIS — E119 Type 2 diabetes mellitus without complications: Secondary | ICD-10-CM | POA: Diagnosis not present

## 2018-09-05 DIAGNOSIS — H20011 Primary iridocyclitis, right eye: Secondary | ICD-10-CM | POA: Diagnosis not present

## 2018-09-05 DIAGNOSIS — B0052 Herpesviral keratitis: Secondary | ICD-10-CM | POA: Diagnosis not present

## 2018-09-12 DIAGNOSIS — B0052 Herpesviral keratitis: Secondary | ICD-10-CM | POA: Diagnosis not present

## 2018-09-12 DIAGNOSIS — H20011 Primary iridocyclitis, right eye: Secondary | ICD-10-CM | POA: Diagnosis not present

## 2018-11-07 DIAGNOSIS — I10 Essential (primary) hypertension: Secondary | ICD-10-CM | POA: Diagnosis not present

## 2018-11-07 DIAGNOSIS — E118 Type 2 diabetes mellitus with unspecified complications: Secondary | ICD-10-CM | POA: Diagnosis not present

## 2019-03-06 DIAGNOSIS — R0981 Nasal congestion: Secondary | ICD-10-CM | POA: Diagnosis not present

## 2019-03-06 DIAGNOSIS — I10 Essential (primary) hypertension: Secondary | ICD-10-CM | POA: Diagnosis not present

## 2019-03-06 DIAGNOSIS — E11649 Type 2 diabetes mellitus with hypoglycemia without coma: Secondary | ICD-10-CM | POA: Diagnosis not present

## 2019-03-06 DIAGNOSIS — E1169 Type 2 diabetes mellitus with other specified complication: Secondary | ICD-10-CM | POA: Diagnosis not present

## 2019-03-31 DIAGNOSIS — M7741 Metatarsalgia, right foot: Secondary | ICD-10-CM | POA: Diagnosis not present

## 2019-04-17 DIAGNOSIS — D649 Anemia, unspecified: Secondary | ICD-10-CM | POA: Diagnosis not present

## 2019-04-17 DIAGNOSIS — R143 Flatulence: Secondary | ICD-10-CM | POA: Diagnosis not present

## 2019-04-17 DIAGNOSIS — Z0000 Encounter for general adult medical examination without abnormal findings: Secondary | ICD-10-CM | POA: Diagnosis not present

## 2019-04-17 DIAGNOSIS — H6121 Impacted cerumen, right ear: Secondary | ICD-10-CM | POA: Diagnosis not present

## 2019-04-17 DIAGNOSIS — G5791 Unspecified mononeuropathy of right lower limb: Secondary | ICD-10-CM | POA: Diagnosis not present

## 2019-04-19 DIAGNOSIS — J028 Acute pharyngitis due to other specified organisms: Secondary | ICD-10-CM | POA: Diagnosis not present

## 2019-04-19 DIAGNOSIS — M62838 Other muscle spasm: Secondary | ICD-10-CM | POA: Diagnosis not present

## 2019-05-02 DIAGNOSIS — Z1231 Encounter for screening mammogram for malignant neoplasm of breast: Secondary | ICD-10-CM | POA: Diagnosis not present

## 2019-05-02 DIAGNOSIS — Z803 Family history of malignant neoplasm of breast: Secondary | ICD-10-CM | POA: Diagnosis not present

## 2019-11-30 DIAGNOSIS — M25532 Pain in left wrist: Secondary | ICD-10-CM | POA: Diagnosis not present

## 2019-11-30 DIAGNOSIS — M25531 Pain in right wrist: Secondary | ICD-10-CM | POA: Diagnosis not present

## 2019-11-30 DIAGNOSIS — G5601 Carpal tunnel syndrome, right upper limb: Secondary | ICD-10-CM | POA: Diagnosis not present

## 2019-11-30 DIAGNOSIS — G5602 Carpal tunnel syndrome, left upper limb: Secondary | ICD-10-CM | POA: Diagnosis not present

## 2019-12-18 DIAGNOSIS — G5601 Carpal tunnel syndrome, right upper limb: Secondary | ICD-10-CM | POA: Diagnosis not present

## 2019-12-18 DIAGNOSIS — G5602 Carpal tunnel syndrome, left upper limb: Secondary | ICD-10-CM | POA: Diagnosis not present

## 2020-01-23 ENCOUNTER — Other Ambulatory Visit: Payer: Self-pay | Admitting: Internal Medicine

## 2020-01-23 DIAGNOSIS — E210 Primary hyperparathyroidism: Secondary | ICD-10-CM | POA: Diagnosis not present

## 2020-01-23 DIAGNOSIS — G5602 Carpal tunnel syndrome, left upper limb: Secondary | ICD-10-CM | POA: Diagnosis not present

## 2020-01-23 DIAGNOSIS — M25532 Pain in left wrist: Secondary | ICD-10-CM | POA: Diagnosis not present

## 2020-01-23 DIAGNOSIS — Z13820 Encounter for screening for osteoporosis: Secondary | ICD-10-CM | POA: Diagnosis not present

## 2020-01-23 DIAGNOSIS — Z7189 Other specified counseling: Secondary | ICD-10-CM | POA: Diagnosis not present

## 2020-01-29 DIAGNOSIS — E210 Primary hyperparathyroidism: Secondary | ICD-10-CM | POA: Diagnosis not present

## 2020-01-30 DIAGNOSIS — I10 Essential (primary) hypertension: Secondary | ICD-10-CM | POA: Diagnosis not present

## 2020-01-30 DIAGNOSIS — E1169 Type 2 diabetes mellitus with other specified complication: Secondary | ICD-10-CM | POA: Diagnosis not present

## 2020-01-30 DIAGNOSIS — E785 Hyperlipidemia, unspecified: Secondary | ICD-10-CM | POA: Diagnosis not present

## 2020-02-20 DIAGNOSIS — I10 Essential (primary) hypertension: Secondary | ICD-10-CM | POA: Diagnosis not present

## 2020-02-20 DIAGNOSIS — E210 Primary hyperparathyroidism: Secondary | ICD-10-CM | POA: Diagnosis not present

## 2020-02-20 DIAGNOSIS — Z13820 Encounter for screening for osteoporosis: Secondary | ICD-10-CM | POA: Diagnosis not present

## 2020-03-25 ENCOUNTER — Other Ambulatory Visit: Payer: BLUE CROSS/BLUE SHIELD

## 2020-03-28 DIAGNOSIS — E210 Primary hyperparathyroidism: Secondary | ICD-10-CM | POA: Diagnosis not present

## 2020-04-09 ENCOUNTER — Other Ambulatory Visit (HOSPITAL_COMMUNITY): Payer: Self-pay | Admitting: Surgery

## 2020-04-09 ENCOUNTER — Other Ambulatory Visit: Payer: Self-pay | Admitting: Surgery

## 2020-04-09 DIAGNOSIS — E210 Primary hyperparathyroidism: Secondary | ICD-10-CM

## 2020-06-07 ENCOUNTER — Ambulatory Visit (HOSPITAL_COMMUNITY)
Admission: RE | Admit: 2020-06-07 | Discharge: 2020-06-07 | Disposition: A | Discharge status: Home / Self Care | Payer: Commercial Managed Care - PPO | Source: Ambulatory Visit | Attending: Surgery | Admitting: Surgery

## 2020-06-07 ENCOUNTER — Other Ambulatory Visit (HOSPITAL_COMMUNITY): Payer: BLUE CROSS/BLUE SHIELD

## 2020-06-07 ENCOUNTER — Ambulatory Visit (HOSPITAL_COMMUNITY): Payer: BLUE CROSS/BLUE SHIELD

## 2020-06-07 ENCOUNTER — Encounter (HOSPITAL_COMMUNITY)
Admission: RE | Admit: 2020-06-07 | Discharge: 2020-06-07 | Disposition: A | Discharge status: Home / Self Care | Payer: Commercial Managed Care - PPO | Source: Ambulatory Visit | Attending: Surgery | Admitting: Surgery

## 2020-06-07 ENCOUNTER — Other Ambulatory Visit: Payer: Self-pay

## 2020-06-07 DIAGNOSIS — E210 Primary hyperparathyroidism: Secondary | ICD-10-CM | POA: Insufficient documentation

## 2020-06-07 MED ORDER — TECHNETIUM TC 99M SESTAMIBI - CARDIOLITE
25.3000 | Freq: Once | INTRAVENOUS | Status: AC | PRN
  Administered 2020-06-07: 09:00:00 25.3 via INTRAVENOUS

## 2020-07-10 ENCOUNTER — Ambulatory Visit: Payer: Self-pay | Admitting: Surgery

## 2020-08-09 NOTE — Patient Instructions (Addendum)
DUE TO COVID-19 ONLY ONE VISITOR IS ALLOWED TO COME WITH YOU AND STAY IN THE WAITING ROOM ONLY DURING PRE OP AND PROCEDURE DAY OF SURGERY. THE 1 VISITOR  MAY VISIT WITH YOU AFTER SURGERY IN YOUR PRIVATE ROOM DURING VISITING HOURS ONLY!  YOU NEED TO HAVE A COVID 19 TEST ON: 08/12/20 , THIS TEST MUST BE DONE BEFORE SURGERY,  COVID TESTING SITE [address] [place] [address], IT IS ON THE RIGHT GOING OUT [address] APPROXIMATELY  2 MINUTES PAST [employer] ON THE RIGHT. ONCE YOUR COVID TEST IS COMPLETED,  PLEASE BEGIN THE QUARANTINE INSTRUCTIONS AS OUTLINED IN YOUR HANDOUT.                Cheryl Huang    Your procedure is scheduled on: 08/15/20   Report to [facility] Main  Entrance   Report to admitting at: 10:00 AM     Call this number if you have problems the morning of surgery [phone]    Remember: Do not eat solid food  :After Midnight. Clear liquids from midnight until: 9:00 am.  CLEAR LIQUID DIET   Foods Allowed                                                                     Foods Excluded  Coffee and tea, regular and decaf                             liquids that you cannot  Plain Jell-O any favor except red or purple                                           see through such as: Fruit ices (not with fruit pulp)                                     milk, soups, orange juice  Iced Popsicles                                    All solid food Carbonated beverages, regular and diet                                    Cranberry, grape and apple juices Sports drinks like Gatorade Lightly seasoned clear broth or consume(fat free) Sugar, honey syrup  Sample Menu Breakfast                                Lunch                                     Supper Cranberry juice  Beef broth                            Chicken broth Jell-O                                     Grape juice                           Apple juice Coffee or tea                         Jell-O                                      Popsicle                                                Coffee or tea                        Coffee or tea  _____________________________________________________________________  BRUSH YOUR TEETH MORNING OF SURGERY AND RINSE YOUR MOUTH OUT, NO CHEWING GUM CANDY OR MINTS.     Take these medicines the morning of surgery with A SIP OF WATER: amlodipine.  How to Manage Your Diabetes Before and After Surgery  Why is it important to control my blood sugar before and after surgery? . Improving blood sugar levels before and after surgery helps healing and can limit problems. . A way of improving blood sugar control is eating a healthy diet by: o  Eating less sugar and carbohydrates o  Increasing activity/exercise o  Talking with your doctor about reaching your blood sugar goals . High blood sugars (greater than 180 mg/dL) can raise your risk of infections and slow your recovery, so you will need to focus on controlling your diabetes during the weeks before surgery. . Make sure that the doctor who takes care of your diabetes knows about your planned surgery including the date and location.  How do I manage my blood sugar before surgery? . Check your blood sugar at least 4 times a day, starting 2 days before surgery, to make sure that the level is not too high or low. o Check your blood sugar the morning of your surgery when you wake up and every 2 hours until you get to the Short Stay unit. . If your blood sugar is less than 70 mg/dL, you will need to treat for low blood sugar: o Do not take insulin. o Treat a low blood sugar (less than 70 mg/dL) with  cup of clear juice (cranberry or apple), 4 glucose tablets, OR glucose gel. o Recheck blood sugar in 15 minutes after treatment (to make sure it is greater than 70 mg/dL). If your blood sugar is not greater than 70 mg/dL on recheck, call [phone] for further  instructions. . Report your blood sugar to the short stay nurse when you get to Short Stay.  . If you are admitted to the hospital after surgery: o Your blood sugar will be checked by the staff and you will probably be given  insulin after surgery (instead of oral diabetes medicines) to make sure you have good blood sugar levels. o The goal for blood sugar control after surgery is 80-180 mg/dL.   WHAT DO I DO ABOUT MY DIABETES MEDICATION?  Marland Kitchen. Do not take oral diabetes medicines (pills) the morning of surgery.  . THE DAY BEFORE SURGERY, take Metformin as usual.       . THE MORNING OF SURGERY, DO NOT take Metformin.  DO NOT TAKE ANY DIABETIC MEDICATIONS DAY OF YOUR SURGERY                               You may not have any metal on your body including hair pins and              piercings  Do not wear jewelry, make-up, lotions, powders or perfumes, deodorant             Do not wear nail polish on your fingernails.  Do not shave  48 hours prior to surgery.               Do not bring valuables to the hospital. [facility] IS NOT             RESPONSIBLE   FOR VALUABLES.  Contacts, dentures or bridgework may not be worn into surgery.  Leave suitcase in the car. After surgery it may be brought to your room.     Patients discharged the day of surgery will not be allowed to drive home. IF YOU ARE HAVING SURGERY AND GOING HOME THE SAME DAY, YOU MUST HAVE AN ADULT TO DRIVE YOU HOME AND BE WITH YOU FOR 24 HOURS. YOU MAY GO HOME BY TAXI OR UBER OR ORTHERWISE, BUT AN ADULT MUST ACCOMPANY YOU HOME AND STAY WITH YOU FOR 24 HOURS.  Name and phone number of your driver:  Special Instructions: N/A              Please read over the following fact sheets you were given: _____________________________________________________________________        [facility] - Preparing for Surgery Before surgery, you can play an important role.  Because skin is not sterile, your skin needs to be as free of germs as  possible.  You can reduce the number of germs on your skin by washing with CHG (chlorahexidine gluconate) soap before surgery.  CHG is an antiseptic cleaner which kills germs and bonds with the skin to continue killing germs even after washing. Please DO NOT use if you have an allergy to CHG or antibacterial soaps.  If your skin becomes reddened/irritated stop using the CHG and inform your nurse when you arrive at Short Stay. Do not shave (including legs and underarms) for at least 48 hours prior to the first CHG shower.  You may shave your face/neck. Please follow these instructions carefully:  1.  Shower with CHG Soap the night before surgery and the  morning of Surgery.  2.  If you choose to wash your hair, wash your hair first as usual with your  normal  shampoo.  3.  After you shampoo, rinse your hair and body thoroughly to remove the  shampoo.                           4.  Use CHG as you would any other liquid soap.  You can apply chg directly  to the skin and wash                       Gently with a scrungie or clean washcloth.  5.  Apply the CHG Soap to your body ONLY FROM THE NECK DOWN.   Do not use on face/ open                           Wound or open sores. Avoid contact with eyes, ears mouth and genitals (private parts).                       Wash face,  Genitals (private parts) with your normal soap.             6.  Wash thoroughly, paying special attention to the area where your surgery  will be performed.  7.  Thoroughly rinse your body with warm water from the neck down.  8.  DO NOT shower/wash with your normal soap after using and rinsing off  the CHG Soap.                9.  Pat yourself dry with a clean towel.            10.  Wear clean pajamas.            11.  Place clean sheets on your bed the night of your first shower and do not  sleep with pets. Day of Surgery : Do not apply any lotions/deodorants the morning of surgery.  Please wear clean clothes to the hospital/surgery  center.  FAILURE TO FOLLOW THESE INSTRUCTIONS MAY RESULT IN THE CANCELLATION OF YOUR SURGERY PATIENT SIGNATURE_________________________________  NURSE SIGNATURE__________________________________  ________________________________________________________________________

## 2020-08-12 ENCOUNTER — Other Ambulatory Visit: Payer: Self-pay

## 2020-08-12 ENCOUNTER — Encounter (HOSPITAL_COMMUNITY): Payer: Self-pay

## 2020-08-12 ENCOUNTER — Encounter (HOSPITAL_COMMUNITY)
Admission: RE | Admit: 2020-08-12 | Discharge: 2020-08-12 | Disposition: A | Discharge status: Home / Self Care | Payer: Commercial Managed Care - PPO | Source: Ambulatory Visit | Attending: Surgery | Admitting: Surgery

## 2020-08-12 ENCOUNTER — Other Ambulatory Visit (HOSPITAL_COMMUNITY)
Admission: RE | Admit: 2020-08-12 | Discharge: 2020-08-12 | Disposition: A | Discharge status: Home / Self Care | Payer: Commercial Managed Care - PPO | Source: Ambulatory Visit | Attending: Surgery | Admitting: Surgery

## 2020-08-12 DIAGNOSIS — Z20822 Contact with and (suspected) exposure to covid-19: Secondary | ICD-10-CM | POA: Diagnosis not present

## 2020-08-12 DIAGNOSIS — Z01818 Encounter for other preprocedural examination: Secondary | ICD-10-CM | POA: Insufficient documentation

## 2020-08-12 HISTORY — DX: Anemia, unspecified: D64.9

## 2020-08-12 LAB — BASIC METABOLIC PANEL
Anion gap: 10 (ref 5–15)
BUN: 17 mg/dL (ref 8–23)
CO2: 26 mmol/L (ref 22–32)
Calcium: 10.9 mg/dL — ABNORMAL HIGH (ref 8.9–10.3)
Chloride: 105 mmol/L (ref 98–111)
Creatinine, Ser: 0.52 mg/dL (ref 0.44–1.00)
GFR calc Af Amer: >60 mL/min (ref >60)
GFR calc non Af Amer: >60 mL/min (ref >60)
Glucose, Bld: 99 mg/dL (ref 70–99)
Potassium: 3.3 mmol/L — ABNORMAL LOW (ref 3.5–5.1)
Sodium: 141 mmol/L (ref 135–145)

## 2020-08-12 LAB — CBC
HCT: 36.8 % (ref 36.0–46.0)
Hemoglobin: 11.7 g/dL — ABNORMAL LOW (ref 12.0–15.0)
MCH: 26.4 pg (ref 26.0–34.0)
MCHC: 31.8 g/dL (ref 30.0–36.0)
MCV: 83.1 fL (ref 80.0–100.0)
Platelets: 352 10*3/uL (ref 150–400)
RBC: 4.43 MIL/uL (ref 3.87–5.11)
RDW: 13.7 % (ref 11.5–15.5)
WBC: 6.3 10*3/uL (ref 4.0–10.5)
nRBC: 0 % (ref 0.0–0.2)

## 2020-08-12 LAB — SARS CORONAVIRUS 2 (TAT 6-24 HRS): SARS Coronavirus 2: NEGATIVE

## 2020-08-12 LAB — HEMOGLOBIN A1C
Hgb A1c MFr Bld: 6.7 % — ABNORMAL HIGH (ref 4.8–5.6)
Mean Plasma Glucose: 145.59 mg/dL

## 2020-08-12 LAB — GLUCOSE, CAPILLARY: Glucose-Capillary: 124 mg/dL — ABNORMAL HIGH (ref 70–99)

## 2020-08-12 NOTE — Progress Notes (Signed)
Lab. Results: Potassium: 3.3.

## 2020-08-12 NOTE — Progress Notes (Signed)
COVID Vaccine Completed: yes Date COVID Vaccine completed: 07/13/20 COVID vaccine manufacturer: *Pfizer    Moderna   [employer]   PCP - Dr. Mirna Mires Cardiologist - No  Chest x-ray -  EKG -  Stress Test -  ECHO -  Cardiac Cath -   Sleep Study -  CPAP -   Fasting Blood Sugar -  Checks Blood Sugar _____ times a day  Blood Thinner Instructions: Aspirin Instructions: Last Dose:  Anesthesia review:   Patient denies shortness of breath, fever, cough and chest pain at PAT appointment   Patient verbalized understanding of instructions that were given to them at the PAT appointment. Patient was also instructed that they will need to review over the PAT instructions again at home before surgery.

## 2020-08-14 ENCOUNTER — Encounter (HOSPITAL_COMMUNITY): Payer: Self-pay | Admitting: Surgery

## 2020-08-14 DIAGNOSIS — E210 Primary hyperparathyroidism: Secondary | ICD-10-CM | POA: Diagnosis present

## 2020-08-14 NOTE — H&P (Signed)
General Surgery [facility]  Cheryl Huang DOB: 01/28/1961 Single / Language: [place] / Race: Black or African American Female   History of Present Illness  The patient is a 59 year old female who presents with primary hyperparathyroidism.  CHIEF COMPLAINT: primary hyperparathyroidism  Patient is referred by Dr. Debara Pickett for surgical evaluation and management of primary hyperparathyroidism. Patient was noted on routine laboratory studies to have a elevated serum calcium level. Her most recent level in February 2021 was 11.6. By report the patient has an elevated intact PTH level, but I do not have a copy of that laboratory study today. Patient did undergo a 24-hour urine collection for calcium which was elevated at 329. Vitamin D level was slightly low at 22.1 and the patient has been taking vitamin D supplementation since February. Patient has had no prior head or neck surgery. There is no family history of parathyroid disease. Patient does note chronic fatigue. She notes frequent urination. She does note bone and joint pain. She denies nephrolithiasis. She has not had a bone density scan. Patient works in a [employer].   Past Surgical History Cesarean Section - Multiple   Diagnostic Studies History Colonoscopy  5-10 years ago Mammogram  within last year Pap Smear  1-5 years ago  Allergies Penicillin G Benzathine & Proc *PENICILLINS*   Medication History amLODIPine Besylate (10MG  Tablet, Oral) Active. Atorvastatin Calcium (20MG  Tablet, Oral) Active. Diclofenac Potassium (50MG  Tablet, Oral) Active. Indapamide (1.25MG  Tablet, Oral) Active. Iron (325 (65 Fe)MG Tablet, Oral) Active. metFORMIN HCl (500MG  Tablet, Oral) Active. Vitamin D3 (1.25 MG(50000 UT) Tablet, Oral) Active. Medications Reconciled  Social History No alcohol use  No caffeine use  No drug use  Tobacco use  Never smoker.  Family History  Arthritis   Brother, Mother, Sister. Breast Cancer  Sister. Diabetes Mellitus  Brother, Mother, Sister. Hypertension  Brother, Mother, Sister.  Pregnancy / Birth History  Age at menarche  15 years. Gravida  2 Maternal age  7-30 Para  1  Other Problems Arthritis  Diabetes Mellitus  Gastroesophageal Reflux Disease  High blood pressure  Hypercholesterolemia  Thyroid Disease   Review of Systems General Present- Weight Gain. Not Present- Appetite Loss, Chills, Fatigue, Fever, Night Sweats and Weight Loss. Skin Present- Dryness. Not Present- Change in Wart/Mole, Hives, Jaundice, New Lesions, Non-Healing Wounds, Rash and Ulcer. HEENT Present- Seasonal Allergies. Not Present- Earache, Hearing Loss, Hoarseness, Nose Bleed, Oral Ulcers, Ringing in the Ears, Sinus Pain, Sore Throat, Visual Disturbances, Wears glasses/contact lenses and Yellow Eyes. Respiratory Present- Wheezing. Not Present- Bloody sputum, Chronic Cough, Difficulty Breathing and Snoring. Cardiovascular Not Present- Chest Pain, Difficulty Breathing Lying Down, Leg Cramps, Palpitations, Rapid Heart Rate, Shortness of Breath and Swelling of Extremities. Gastrointestinal Present- Bloating. Not Present- Abdominal Pain, Bloody Stool, Change in Bowel Habits, Chronic diarrhea, Constipation, Difficulty Swallowing, Excessive gas, Gets full quickly at meals, Hemorrhoids, Indigestion, Nausea, Rectal Pain and Vomiting. Female Genitourinary Not Present- Frequency, Nocturia, Painful Urination, Pelvic Pain and Urgency. Neurological Not Present- Decreased Memory, Fainting, Headaches, Numbness, Seizures, Tingling, Tremor, Trouble walking and Weakness. Psychiatric Not Present- Anxiety, Bipolar, Change in Sleep Pattern, Depression, Fearful and Frequent crying. Endocrine Present- Hot flashes. Not Present- Cold Intolerance, Excessive Hunger, Hair Changes, Heat Intolerance and New Diabetes. Hematology Not Present- Blood Thinners, Easy Bruising,  Excessive bleeding, Gland problems, HIV and Persistent Infections.  Vitals Weight: 148.25 lb Height: 60in Body Surface Area: 1.64 m Body Mass Index: 28.95 kg/m  Temp.: 97.13F  Pulse: 79 (  Regular)    Physical Exam   GENERAL APPEARANCE Development: normal Nutritional status: normal Gross deformities: none  SKIN Rash, lesions, ulcers: none Induration, erythema: none Nodules: none palpable  EYES Conjunctiva and lids: normal Pupils: equal and reactive Iris: normal bilaterally  EARS, NOSE, MOUTH, THROAT External ears: no lesion or deformity External nose: no lesion or deformity Hearing: grossly normal Due to Covid-19 pandemic, patient is wearing a mask.  NECK Symmetric: yes Trachea: midline Thyroid: no palpable nodules in the thyroid bed  CHEST Respiratory effort: normal Retraction or accessory muscle use: no Breath sounds: normal bilaterally Rales, rhonchi, wheeze: none  CARDIOVASCULAR Auscultation: regular rhythm, normal rate Murmurs: none Pulses: radial pulse 2+ palpable Lower extremity edema: none  MUSCULOSKELETAL Station and gait: normal Digits and nails: no clubbing or cyanosis Muscle strength: grossly normal all extremities Range of motion: grossly normal all extremities Deformity: none  LYMPHATIC Cervical: none palpable Supraclavicular: none palpable  PSYCHIATRIC Oriented to person, place, and time: yes Mood and affect: normal for situation Judgment and insight: appropriate for situation   Assessment & Plan   PRIMARY HYPERPARATHYROIDISM (E21.0)  Follow Up - Call [facility] office after tests / studies doneto discuss further plans  Patient is referred by her endocrinologist, Dr. Debara Pickett, for surgical evaluation of probable primary hyperparathyroidism.  Patient provided with a copy of "Parathyroid Surgery: Treatment for Your Parathyroid Gland Problem", published by [employer], 12 pages. Book reviewed and explained to patient during  visit today.  Laboratory studies are consistent with primary hyperparathyroidism. Patient is symptomatic with chronic fatigue, frequent urination, and bone and joint pain. I have recommended proceeding with further evaluation to include an ultrasound examination of the neck as well as a nuclear medicine parathyroid scan. We will also repeat her intact PTH level. We will contact the patient with the results of these studies when they are available. Hopefully the imaging studies will localize a parathyroid adenoma. The patient and I discussed surgery for parathyroid adenoma. We discussed minimally invasive procedure as an outpatient surgery. We discussed time out of work. If these imaging studies do not indicate a single gland adenoma, then we will consider a 4D CT scan of the neck for evaluation.  Patient will undergo the above studies. We will contact her when the results are available to discuss surgical intervention.  ADDENDUM  Telephone call to patient with results of sestamibi and USN. Positive for right superior adenoma.  Offered return visit to office to discuss surgery, versus discussing by telephone before scheduling procedure. Asked patient to call my nurse with instructions.  Plan minimally invasive out-patient parathyroidectomy at [facility].  Darnell Level, MD [facility] Surgery Office: [phone]

## 2020-08-15 ENCOUNTER — Ambulatory Visit (HOSPITAL_COMMUNITY)
Admission: RE | Admit: 2020-08-15 | Discharge: 2020-08-15 | Disposition: A | Discharge status: Home / Self Care | Payer: Commercial Managed Care - PPO | Attending: Surgery | Admitting: Surgery

## 2020-08-15 ENCOUNTER — Ambulatory Visit (HOSPITAL_COMMUNITY): Payer: Commercial Managed Care - PPO | Admitting: Certified Registered Nurse Anesthetist

## 2020-08-15 ENCOUNTER — Encounter (HOSPITAL_COMMUNITY): Payer: Self-pay | Admitting: Surgery

## 2020-08-15 ENCOUNTER — Encounter (HOSPITAL_COMMUNITY)
Admission: RE | Disposition: A | Discharge status: Home / Self Care | Payer: Self-pay | Source: Home / Self Care | Attending: Surgery

## 2020-08-15 ENCOUNTER — Other Ambulatory Visit: Payer: Self-pay

## 2020-08-15 DIAGNOSIS — Z79899 Other long term (current) drug therapy: Secondary | ICD-10-CM | POA: Diagnosis not present

## 2020-08-15 DIAGNOSIS — M1990 Unspecified osteoarthritis, unspecified site: Secondary | ICD-10-CM | POA: Insufficient documentation

## 2020-08-15 DIAGNOSIS — E7800 Pure hypercholesterolemia, unspecified: Secondary | ICD-10-CM | POA: Insufficient documentation

## 2020-08-15 DIAGNOSIS — K219 Gastro-esophageal reflux disease without esophagitis: Secondary | ICD-10-CM | POA: Diagnosis not present

## 2020-08-15 DIAGNOSIS — E119 Type 2 diabetes mellitus without complications: Secondary | ICD-10-CM | POA: Insufficient documentation

## 2020-08-15 DIAGNOSIS — I10 Essential (primary) hypertension: Secondary | ICD-10-CM | POA: Insufficient documentation

## 2020-08-15 DIAGNOSIS — Z7984 Long term (current) use of oral hypoglycemic drugs: Secondary | ICD-10-CM | POA: Insufficient documentation

## 2020-08-15 DIAGNOSIS — E210 Primary hyperparathyroidism: Secondary | ICD-10-CM | POA: Diagnosis not present

## 2020-08-15 HISTORY — PX: PARATHYROIDECTOMY: SHX19

## 2020-08-15 LAB — GLUCOSE, CAPILLARY
Glucose-Capillary: 114 mg/dL — ABNORMAL HIGH (ref 70–99)
Glucose-Capillary: 159 mg/dL — ABNORMAL HIGH (ref 70–99)

## 2020-08-15 SURGERY — PARATHYROIDECTOMY
Anesthesia: General | Site: Neck | Laterality: Right

## 2020-08-15 MED ORDER — MEPERIDINE HCL 50 MG/ML IJ SOLN: 6.2500 mg | INTRAMUSCULAR | Status: DC | PRN

## 2020-08-15 MED ORDER — ORAL CARE MOUTH RINSE: 15.0000 mL | Freq: Once | OROMUCOSAL | Status: AC

## 2020-08-15 MED ORDER — 0.9 % SODIUM CHLORIDE (POUR BTL) OPTIME
TOPICAL | Status: DC | PRN
  Administered 2020-08-15: 1000 mL

## 2020-08-15 MED ORDER — CHLORHEXIDINE GLUCONATE 0.12 % MT SOLN
15.0000 mL | Freq: Once | OROMUCOSAL | Status: AC
  Administered 2020-08-15: 15 mL via OROMUCOSAL

## 2020-08-15 MED ORDER — TRAMADOL HCL 50 MG PO TABS
50.0000 mg | ORAL_TABLET | Freq: Four times a day (QID) | ORAL | 0 refills | Status: DC | PRN
Start: 2020-08-15 — End: 2023-06-28

## 2020-08-15 MED ORDER — PROPOFOL 10 MG/ML IV BOLUS
INTRAVENOUS | Status: DC | PRN
  Administered 2020-08-15: 70 mg via INTRAVENOUS
  Administered 2020-08-15: 130 mg via INTRAVENOUS

## 2020-08-15 MED ORDER — CHLORHEXIDINE GLUCONATE CLOTH 2 % EX PADS: 6.0000 | MEDICATED_PAD | Freq: Once | CUTANEOUS | Status: DC

## 2020-08-15 MED ORDER — PROMETHAZINE HCL 25 MG/ML IJ SOLN: 6.2500 mg | INTRAMUSCULAR | Status: DC | PRN

## 2020-08-15 MED ORDER — HYDROMORPHONE HCL 1 MG/ML IJ SOLN: 0.2500 mg | INTRAMUSCULAR | Status: DC | PRN

## 2020-08-15 MED ORDER — LACTATED RINGERS IV SOLN: INTRAVENOUS | Status: DC

## 2020-08-15 MED ORDER — MIDAZOLAM HCL 2 MG/2ML IJ SOLN
INTRAMUSCULAR | Status: AC
  Filled 2020-08-15: qty 2

## 2020-08-15 MED ORDER — PROPOFOL 10 MG/ML IV BOLUS
INTRAVENOUS | Status: AC
  Filled 2020-08-15: qty 20

## 2020-08-15 MED ORDER — FENTANYL CITRATE (PF) 250 MCG/5ML IJ SOLN
INTRAMUSCULAR | Status: AC
  Filled 2020-08-15: qty 5

## 2020-08-15 MED ORDER — DEXAMETHASONE SODIUM PHOSPHATE 10 MG/ML IJ SOLN
INTRAMUSCULAR | Status: DC | PRN
  Administered 2020-08-15: 6 mg via INTRAVENOUS

## 2020-08-15 MED ORDER — BUPIVACAINE HCL 0.25 % IJ SOLN
INTRAMUSCULAR | Status: DC | PRN
  Administered 2020-08-15: 10 mL

## 2020-08-15 MED ORDER — MIDAZOLAM HCL 5 MG/5ML IJ SOLN
INTRAMUSCULAR | Status: DC | PRN
  Administered 2020-08-15: 2 mg via INTRAVENOUS

## 2020-08-15 MED ORDER — CIPROFLOXACIN IN D5W 400 MG/200ML IV SOLN
400.0000 mg | INTRAVENOUS | Status: AC
  Administered 2020-08-15: 400 mg via INTRAVENOUS
  Filled 2020-08-15: qty 200

## 2020-08-15 MED ORDER — ROCURONIUM BROMIDE 10 MG/ML (PF) SYRINGE
PREFILLED_SYRINGE | INTRAVENOUS | Status: DC | PRN
  Administered 2020-08-15: 50 mg via INTRAVENOUS

## 2020-08-15 MED ORDER — SUGAMMADEX SODIUM 200 MG/2ML IV SOLN
INTRAVENOUS | Status: DC | PRN
  Administered 2020-08-15: 200 mg via INTRAVENOUS

## 2020-08-15 MED ORDER — BUPIVACAINE HCL 0.25 % IJ SOLN
INTRAMUSCULAR | Status: AC
  Filled 2020-08-15: qty 1

## 2020-08-15 MED ORDER — ONDANSETRON HCL 4 MG/2ML IJ SOLN
INTRAMUSCULAR | Status: DC | PRN
  Administered 2020-08-15: 4 mg via INTRAVENOUS

## 2020-08-15 MED ORDER — FENTANYL CITRATE (PF) 100 MCG/2ML IJ SOLN
INTRAMUSCULAR | Status: DC | PRN
Start: 2020-08-15 — End: 2020-08-15
  Administered 2020-08-15 (×2): 50 ug via INTRAVENOUS
  Administered 2020-08-15: 100 ug via INTRAVENOUS

## 2020-08-15 MED ORDER — LIDOCAINE 2% (20 MG/ML) 5 ML SYRINGE
INTRAMUSCULAR | Status: DC | PRN
  Administered 2020-08-15: 100 mg via INTRAVENOUS

## 2020-08-15 SURGICAL SUPPLY — 33 items
ADH SKN CLS APL DERMABOND .7 (GAUZE/BANDAGES/DRESSINGS) ×1
APL PRP STRL LF DISP 70% ISPRP (MISCELLANEOUS)
ATTRACTOMAT 16X20 MAGNETIC DRP (DRAPES) ×2 IMPLANT
BLADE SURG 15 STRL LF DISP TIS (BLADE) ×1 IMPLANT
BLADE SURG 15 STRL SS (BLADE) ×2
CHLORAPREP W/TINT 26 (MISCELLANEOUS) ×1 IMPLANT
CLIP VESOCCLUDE MED 6/CT (CLIP) ×4 IMPLANT
CLIP VESOCCLUDE SM WIDE 6/CT (CLIP) ×4 IMPLANT
COVER SURGICAL LIGHT HANDLE (MISCELLANEOUS) ×2 IMPLANT
COVER WAND RF STERILE (DRAPES) ×2 IMPLANT
DERMABOND ADVANCED (GAUZE/BANDAGES/DRESSINGS) ×1
DERMABOND ADVANCED .7 DNX12 (GAUZE/BANDAGES/DRESSINGS) ×1 IMPLANT
DRAPE LAPAROTOMY T 98X78 PEDS (DRAPES) ×2 IMPLANT
DURAPREP 26ML APPLICATOR (WOUND CARE) ×2 IMPLANT
ELECT REM PT RETURN 15FT ADLT (MISCELLANEOUS) ×2 IMPLANT
GAUZE 4X4 16PLY RFD (DISPOSABLE) ×1 IMPLANT
GLOVE SURG ORTHO 8.0 STRL STRW (GLOVE) ×2 IMPLANT
GOWN STRL REUS W/TWL XL LVL3 (GOWN DISPOSABLE) ×6 IMPLANT
HEMOSTAT SURGICEL 2X4 FIBR (HEMOSTASIS) ×2 IMPLANT
ILLUMINATOR WAVEGUIDE N/F (MISCELLANEOUS) IMPLANT
KIT BASIN OR (CUSTOM PROCEDURE TRAY) ×2 IMPLANT
KIT TURNOVER KIT A (KITS) IMPLANT
NDL HYPO 25X1 1.5 SAFETY (NEEDLE) ×1 IMPLANT
NEEDLE HYPO 25X1 1.5 SAFETY (NEEDLE) ×2 IMPLANT
PACK BASIC VI WITH GOWN DISP (CUSTOM PROCEDURE TRAY) ×2 IMPLANT
PENCIL SMOKE EVACUATOR (MISCELLANEOUS) ×2 IMPLANT
SUT MNCRL AB 4-0 PS2 18 (SUTURE) ×2 IMPLANT
SUT VIC AB 3-0 SH 18 (SUTURE) ×2 IMPLANT
SYR BULB IRRIG 60ML STRL (SYRINGE) ×2 IMPLANT
SYR CONTROL 10ML LL (SYRINGE) ×2 IMPLANT
TOWEL OR 17X26 10 PK STRL BLUE (TOWEL DISPOSABLE) ×2 IMPLANT
TOWEL OR NON WOVEN STRL DISP B (DISPOSABLE) ×2 IMPLANT
TUBING CONNECTING 10 (TUBING) ×2 IMPLANT

## 2020-08-15 NOTE — Interval H&P Note (Signed)
History and Physical Interval Note:  08/15/2020 11:03 AM  Cheryl Huang  has presented today for surgery, with the diagnosis of PRIMARY HYPERPARATHYROIDISM.  The various methods of treatment have been discussed with the patient and family. After consideration of risks, benefits and other options for treatment, the patient has consented to    Procedure(s): RIGHT SUPERIOR PARATHYROIDECTOMY (Right) as a surgical intervention.    The patient's history has been reviewed, patient examined, no change in status, stable for surgery.  I have reviewed the patient's chart and labs.  Questions were answered to the patient's satisfaction.    Darnell Level, MD [facility] Office: [phone]   Darnell Level

## 2020-08-15 NOTE — Transfer of Care (Signed)
Immediate Anesthesia Transfer of Care Note  Patient: Cheryl Huang  Procedure(s) Performed: Procedure(s): RIGHT PARATHYROIDECTOMY (Right)  Patient Location: PACU  Anesthesia Type:General  Level of Consciousness:  sedated, patient cooperative and responds to stimulation  Airway & Oxygen Therapy:Patient Spontanous Breathing and Patient connected to face mask oxgen  Post-op Assessment:  Report given to PACU RN and Post -op Vital signs reviewed and stable  Post vital signs:  Reviewed and stable  Last Vitals:  Vitals:   08/15/20 1004  BP: (!) 159/67  Pulse: 78  Resp: 18  Temp: 36.7 C  SpO2: 100%    Complications: No apparent anesthesia complications

## 2020-08-15 NOTE — Anesthesia Preprocedure Evaluation (Signed)
Anesthesia Evaluation  Patient identified by MRN, date of birth, ID band Patient awake    Reviewed: Allergy & Precautions, NPO status , Patient's Chart, lab work & pertinent test results  Airway Mallampati: II  TM Distance: >3 FB Neck ROM: Full    Dental no notable dental hx.    Pulmonary neg pulmonary ROS,    Pulmonary exam normal breath sounds clear to auscultation       Cardiovascular hypertension, Pt. on medications Normal cardiovascular exam Rhythm:Regular Rate:Normal     Neuro/Psych negative neurological ROS  negative psych ROS   GI/Hepatic negative GI ROS, Neg liver ROS,   Endo/Other  negative endocrine ROSdiabetes  Renal/GU negative Renal ROS     Musculoskeletal negative musculoskeletal ROS (+)   Abdominal   Peds  Hematology  (+) Blood dyscrasia, anemia ,   Anesthesia Other Findings   Reproductive/Obstetrics negative OB ROS                             Anesthesia Physical Anesthesia Plan  ASA: II  Anesthesia Plan: General   Post-op Pain Management:    Induction: Intravenous  PONV Risk Score and Plan: 4 or greater and Ondansetron, Dexamethasone, Treatment may vary due to age or medical condition and Midazolam  Airway Management Planned: Oral ETT  Additional Equipment: None  Intra-op Plan:   Post-operative Plan: Extubation in OR  Informed Consent: I have reviewed the patients History and Physical, chart, labs and discussed the procedure including the risks, benefits and alternatives for the proposed anesthesia with the patient or authorized representative who has indicated his/her understanding and acceptance.     Dental advisory given  Plan Discussed with: CRNA  Anesthesia Plan Comments:         Anesthesia Quick Evaluation

## 2020-08-15 NOTE — Op Note (Signed)
OPERATIVE REPORT - PARATHYROIDECTOMY  Preoperative diagnosis: Primary hyperparathyroidism  Postop diagnosis: Same  Procedure: Right minimally invasive parathyroidectomy  Surgeon:  Darnell Level, MD  Anesthesia: General endotracheal  Estimated blood loss: Minimal  Preparation: ChloraPrep  Indications: Patient is referred by Dr. Debara Pickett for surgical evaluation and management of primary hyperparathyroidism. Patient was noted on routine laboratory studies to have a elevated serum calcium level. Her most recent level in February 2021 was 11.6. By report the patient has an elevated intact PTH level, but I do not have a copy of that laboratory study today. Patient did undergo a 24-hour urine collection for calcium which was elevated at 329. Vitamin D level was slightly low at 22.1 and the patient has been taking vitamin D supplementation since February. Sestamibi scan localized a right superior adenoma.  Patient now comes to surgery for parathyroidectomy.  Procedure: The patient was prepared in the pre-operative holding area. The patient was brought to the operating room and placed in a supine position on the operating room table. Following administration of general anesthesia, the patient was positioned and then prepped and draped in the usual strict aseptic fashion. After ascertaining that an adequate level of anesthesia been achieved, a neck incision was made with a #15 blade. Dissection was carried through subcutaneous tissues and platysma. Hemostasis was obtained with the electrocautery. Skin flaps were developed circumferentially and a Weitlander retractor was placed for exposure.  Strap muscles were incised in the midline. Strap muscles were reflected laterally exposing the thyroid lobe. With gentle blunt dissection the thyroid lobe was mobilized.  A normal appearing parathyroid gland was noted on the anterior surface of the superior thyroid pole on the right. Dissection was carried through  adipose tissue and an enlarged parathyroid gland was identified posterior to the superior pole of the thyroid. It was gently mobilized. Vascular structures were divided between small ligaclips. Care was taken to avoid the recurrent laryngeal nerve and the esophagus. The parathyroid gland was completely excised. It was submitted to pathology where frozen section confirmed parathyroid tissue consistent with adenoma. The smaller nodule from the superior pole was also submitted and parathyroid tissue was confirmed.  Neck was irrigated with warm saline and good hemostasis was noted. Fibrillar was placed in the operative field. Strap muscles were approximated in the midline with interrupted 3-0 Vicryl sutures. Platysma was closed with interrupted 3-0 Vicryl sutures. Marcaine was infiltrated circumferentially. Skin was closed with a running 4-0 Monocryl subcuticular suture. Wound was washed and dried and Dermabond was applied. Patient was awakened from anesthesia and brought to the recovery room. The patient tolerated the procedure well.   Darnell Level, MD [facility] Office: [phone]

## 2020-08-15 NOTE — Anesthesia Procedure Notes (Signed)
Procedure Name: Intubation Date/Time: 08/15/2020 11:42 AM Performed by: Lavina Hamman, CRNA Pre-anesthesia Checklist: Patient identified, Emergency Drugs available, Suction available, Patient being monitored and Timeout performed Patient Re-evaluated:Patient Re-evaluated prior to induction Oxygen Delivery Method: Circle system utilized Preoxygenation: Pre-oxygenation with 100% oxygen Induction Type: IV induction Ventilation: Mask ventilation without difficulty and Oral airway inserted - appropriate to patient size Laryngoscope Size: Mac and 3 Grade View: Grade III Tube type: Oral Tube size: 7.0 mm Number of attempts: 1 Airway Equipment and Method: Stylet Placement Confirmation: ETT inserted through vocal cords under direct vision,  positive ETCO2,  CO2 detector and breath sounds checked- equal and bilateral Secured at: 21 cm Tube secured with: Tape Dental Injury: Teeth and Oropharynx as per pre-operative assessment  Difficulty Due To: Difficulty was anticipated, Difficult Airway- due to reduced neck mobility, Difficult Airway- due to dentition, Difficult Airway- due to limited oral opening and Difficult Airway- due to anterior larynx Future Recommendations: Recommend- induction with short-acting agent, and alternative techniques readily available Comments: G3 with Mac3.  G1 with Glide 3.  ATOI, teeth unchanged.

## 2020-08-16 ENCOUNTER — Encounter (HOSPITAL_COMMUNITY): Payer: Self-pay | Admitting: Surgery

## 2020-08-16 LAB — SURGICAL PATHOLOGY

## 2020-08-16 NOTE — Anesthesia Postprocedure Evaluation (Signed)
Anesthesia Post Note  Patient: Cheryl Huang  Procedure(s) Performed: RIGHT PARATHYROIDECTOMY (Right Neck)     Patient location during evaluation: PACU Anesthesia Type: General Level of consciousness: sedated and patient cooperative Pain management: pain level controlled Vital Signs Assessment: post-procedure vital signs reviewed and stable Respiratory status: spontaneous breathing Cardiovascular status: stable Anesthetic complications: no   No complications documented.  Last Vitals:  Vitals:   08/15/20 1324 08/15/20 1400  BP: 135/67 128/63  Pulse: 70 70  Resp: 15 15  Temp: 36.5 C 36.5 C  SpO2: 98% 99%    Last Pain:  Vitals:   08/16/20 0952  TempSrc:   PainSc: 0-No pain   Pain Goal: Patients Stated Pain Goal: 4 (08/15/20 1016)                 Lewie Loron

## 2021-02-17 IMAGING — US US THYROID
1 series · 13 of 25 positions shown · non-contrast
Comparison: None.

CLINICAL DATA: 59-year-old female with a history of primary
hyperparathyroidism

EXAM:
THYROID ULTRASOUND
TECHNIQUE: Ultrasound examination of the thyroid gland and adjacent soft
tissues was performed.

[Series 1: us thyroid · 13 of 54 slices shown]
[im 1/54]
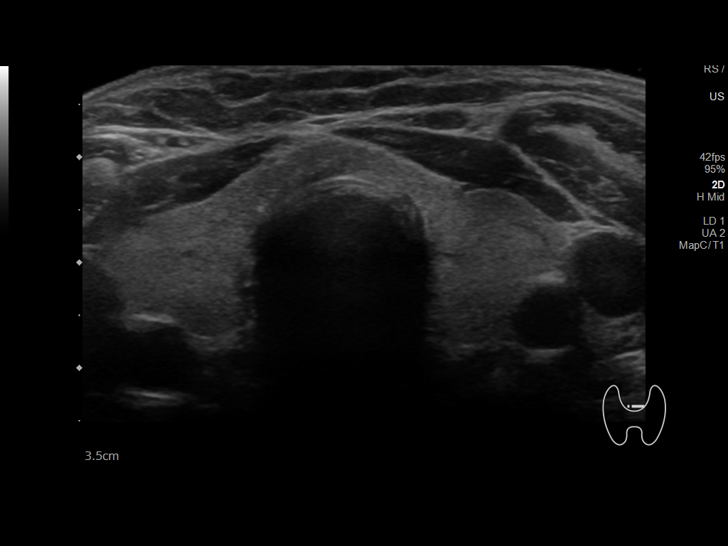
[im 5/54]
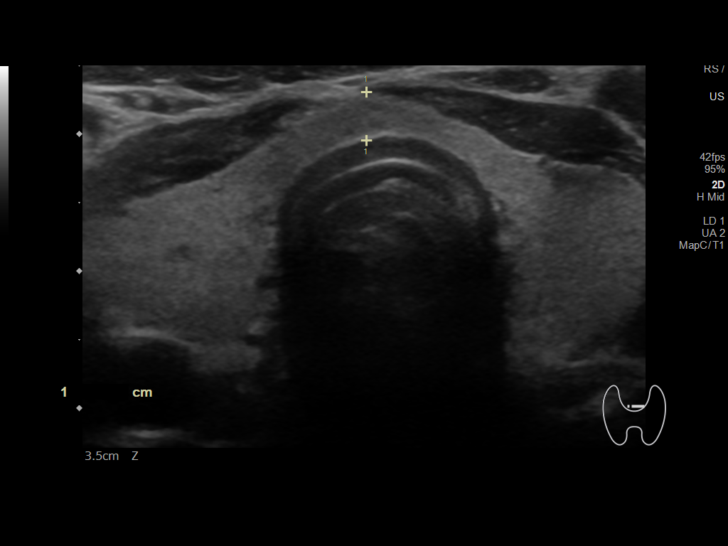
[im 9/54]
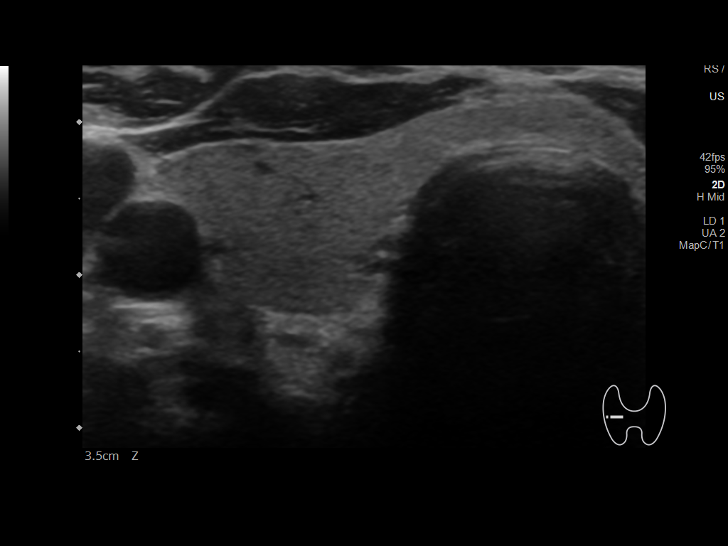
[im 14/54]
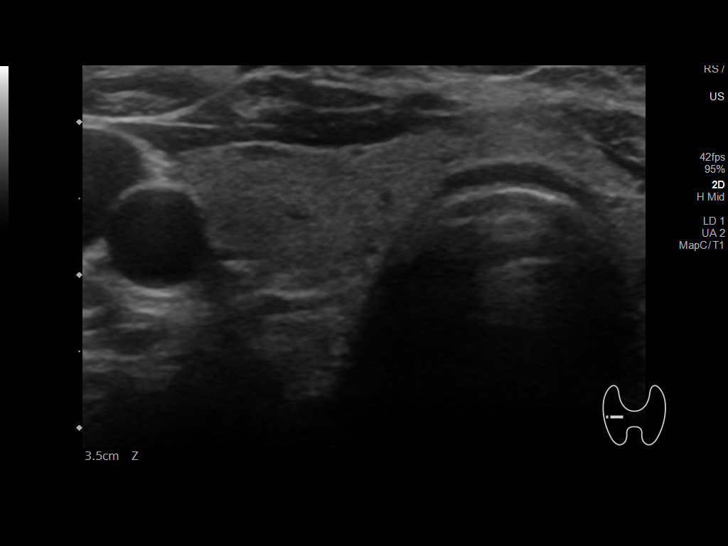
[im 18/54]
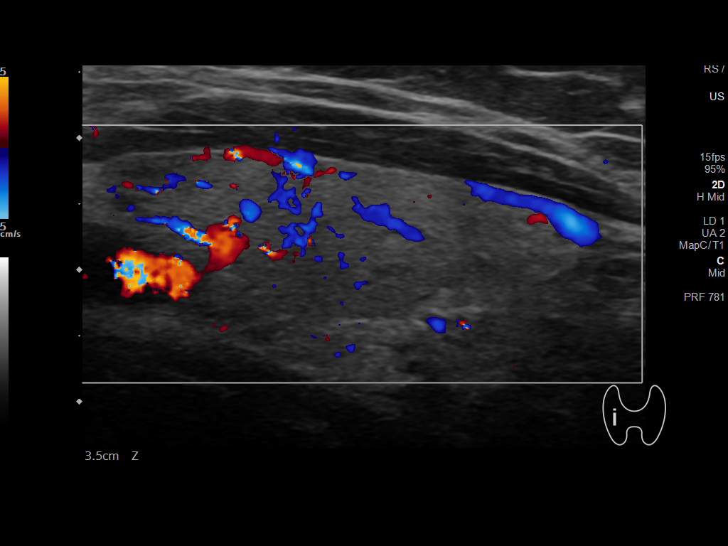
[im 23/54]
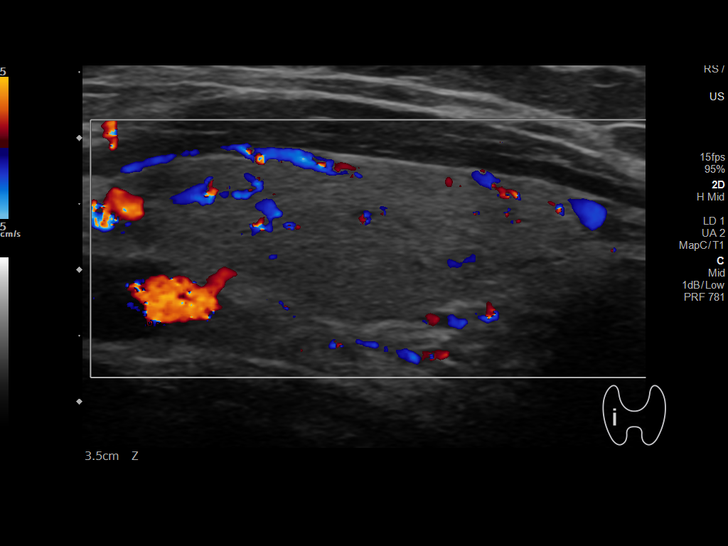
[im 27/54]
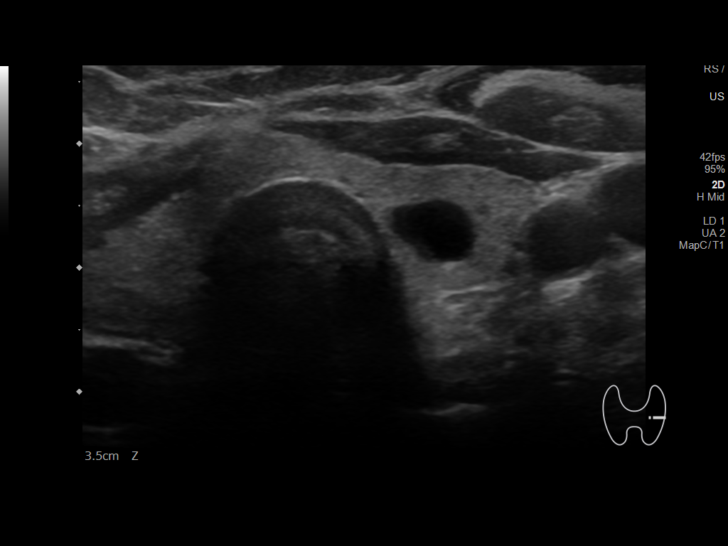
[im 31/54]
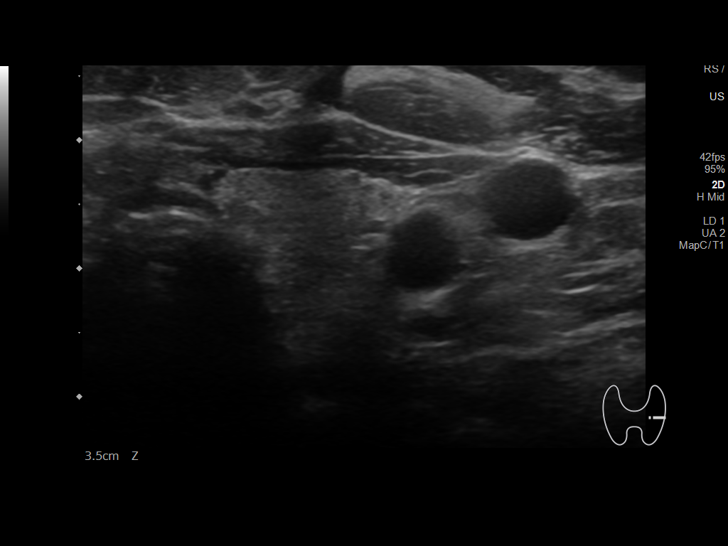
[im 36/54]
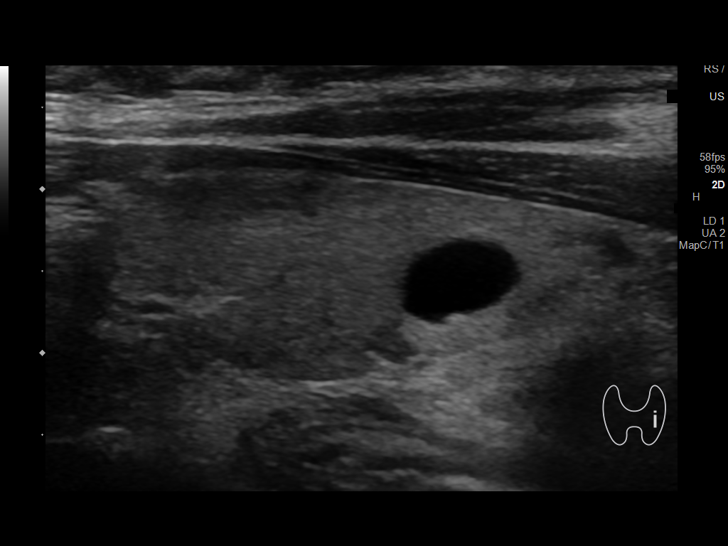
[im 40/54]
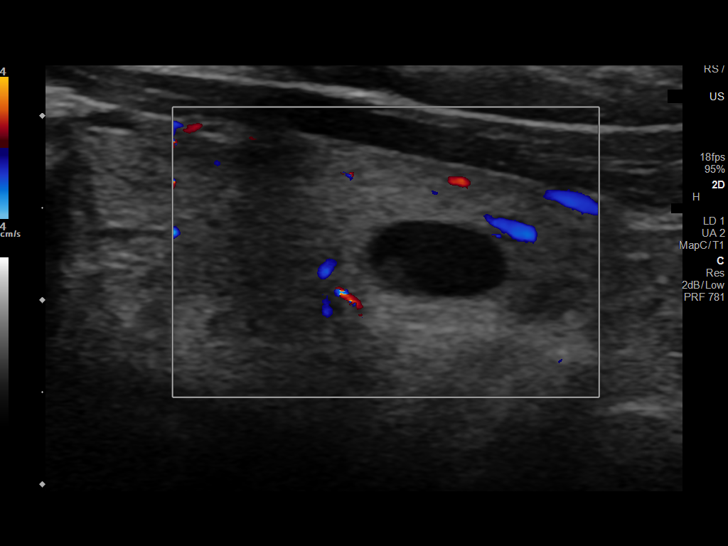
[im 45/54]
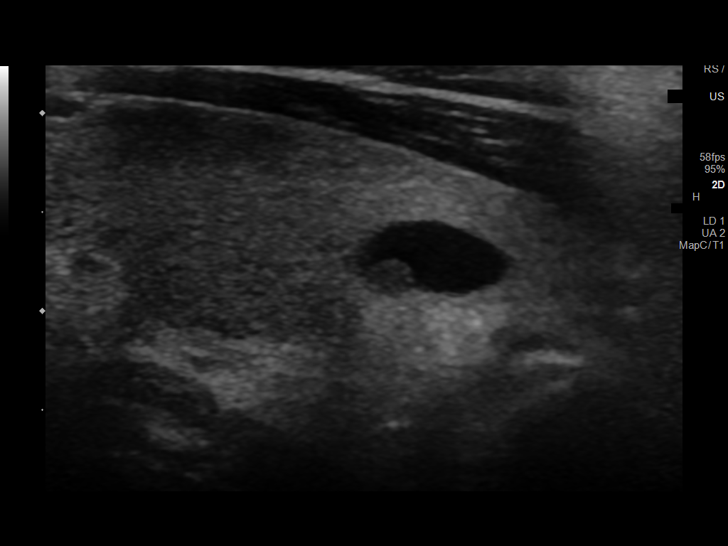
[im 49/54]
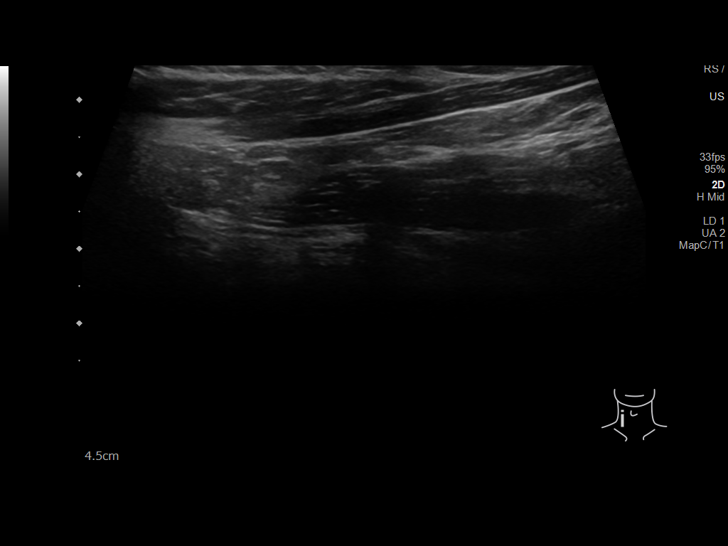
[im 54/54]
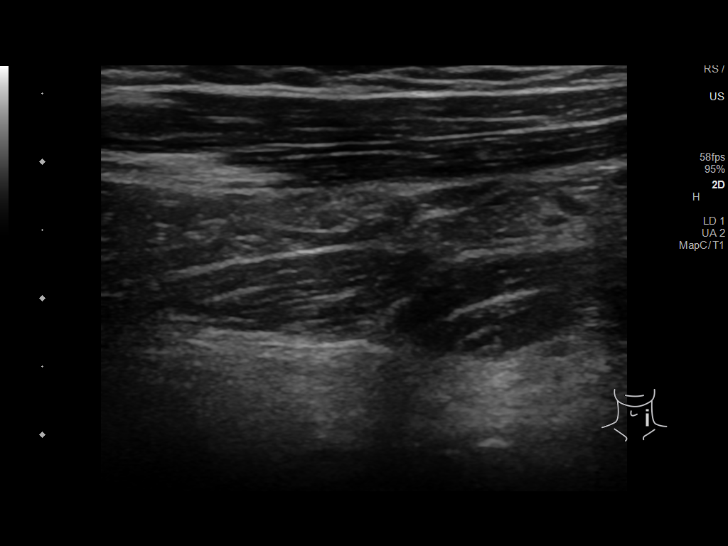

[13 of 25 positions shown; findings below may reference images not displayed]

FINDINGS: Parenchymal Echotexture: Mildly heterogenous

Isthmus: 0.4 cm

Right lobe: 4.0 cm x 1.4 cm x 1.6 cm

Left lobe: 3.4 cm x 1.2 cm x 1.3 cm

_________________________________________________________

Estimated total number of nodules >/= 1 cm: 0

Number of spongiform nodules >/=  2 cm not described below (TR1): 0

Number of mixed cystic and solid nodules >/= 1.5 cm not described
below (TR2): 0

_________________________________________________________

Nodule # 1:

Location: Left; Mid

Maximum size: 0.8 cm; Other 2 dimensions: 0.6 cm x 0.5 cm

Composition: cystic/almost completely cystic (0)

Echogenicity: anechoic (0)

Shape: not taller-than-wide (0)

Margins: smooth (0)

Echogenic foci: none (0)

ACR TI-RADS total points: 0.

ACR TI-RADS risk category: TR1 (0-1 points).

ACR TI-RADS recommendations:

Cystic nodule does not meet criteria for surveillance or biopsy

_________________________________________________________

No adenopathy
IMPRESSION: Mildly heterogeneous thyroid may indicate medical thyroid disease.

No thyroid nodule meets criteria for biopsy or surveillance, as
designated by the newly established ACR TI-RADS criteria.

Recommendations follow those established by the new ACR TI-RADS
criteria ([HOSPITAL] 2398;[DATE]).

## 2021-02-17 IMAGING — NM NM PARATHYROID W/ SPECT
3 series · 18 of 18 positions shown · non-contrast
Comparison: Thyroid ultrasound 06/07/2020

CLINICAL DATA: Hyperparathyroidism.  Follow-up scan

EXAM:
NM PARATHYROID SCINTIGRAPHY AND SPECT IMAGING
TECHNIQUE: Following intravenous administration of radiopharmaceutical, early
and 2-hour delayed planar images were obtained in the anterior
projection. Delayed triplanar SPECT images were also obtained at 2
hours.
RADIOPHARMACEUTICALS:  25.3 mCi 0c-44m Sestamibi IV

[Series 1: spect - (id)_(id)_cor · 4.1mm · 4.14mm/px · 6 of 128 frames shown]
[frame 11/128]
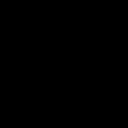
[frame 32/128]
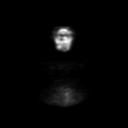
[frame 54/128]
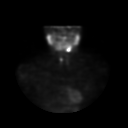
[frame 75/128]
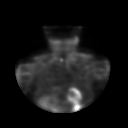
[frame 96/128]
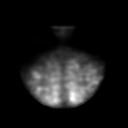
[frame 118/128]
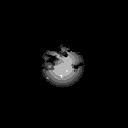

[Series 1: spect - (id)_(id)_tra · 4.1mm · 4.14mm/px · 6 of 128 frames shown]
[frame 11/128]
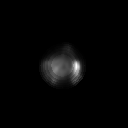
[frame 32/128]
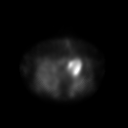
[frame 54/128]
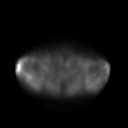
[frame 75/128]
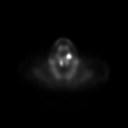
[frame 96/128]
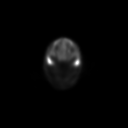
[frame 118/128]
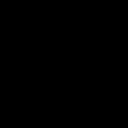

[Series 2: spect parathyroid · 4.14mm/px · 6 of 64 frames shown]
[frame 6/64]
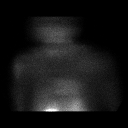
[frame 16/64]
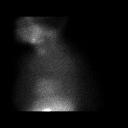
[frame 27/64]
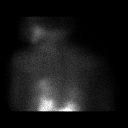
[frame 38/64]
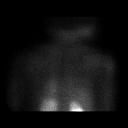
[frame 48/64]
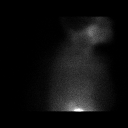
[frame 59/64]
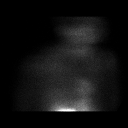

[18 of 18 positions shown; findings below may reference images not displayed]

FINDINGS: Washout of activity from the thyroid gland at 2 hour static planar
imaging. There is a focus of retained activity in the mid RIGHT lobe
of thyroid gland. SPECT imaging of the neck and chest demonstrates
focal activity within the region the upper pole RIGHT lobe of the
thyroid gland.
IMPRESSION: Residual activity localizing to the upper pole of the RIGHT lobe of
the thyroid gland would be consistent with a parathyroid adenoma.

## 2021-11-12 DIAGNOSIS — Z01411 Encounter for gynecological examination (general) (routine) with abnormal findings: Secondary | ICD-10-CM | POA: Diagnosis not present

## 2021-11-12 DIAGNOSIS — Z01419 Encounter for gynecological examination (general) (routine) without abnormal findings: Secondary | ICD-10-CM | POA: Diagnosis not present

## 2021-11-12 DIAGNOSIS — Z8619 Personal history of other infectious and parasitic diseases: Secondary | ICD-10-CM | POA: Diagnosis not present

## 2021-11-12 DIAGNOSIS — R350 Frequency of micturition: Secondary | ICD-10-CM | POA: Diagnosis not present

## 2021-11-12 DIAGNOSIS — R140 Abdominal distension (gaseous): Secondary | ICD-10-CM | POA: Diagnosis not present

## 2021-11-12 DIAGNOSIS — R69 Illness, unspecified: Secondary | ICD-10-CM | POA: Diagnosis not present

## 2021-11-17 DIAGNOSIS — E785 Hyperlipidemia, unspecified: Secondary | ICD-10-CM | POA: Diagnosis not present

## 2021-11-17 DIAGNOSIS — R350 Frequency of micturition: Secondary | ICD-10-CM | POA: Diagnosis not present

## 2021-11-17 DIAGNOSIS — E1165 Type 2 diabetes mellitus with hyperglycemia: Secondary | ICD-10-CM | POA: Diagnosis not present

## 2021-11-17 DIAGNOSIS — R140 Abdominal distension (gaseous): Secondary | ICD-10-CM | POA: Diagnosis not present

## 2021-11-17 DIAGNOSIS — E1169 Type 2 diabetes mellitus with other specified complication: Secondary | ICD-10-CM | POA: Diagnosis not present

## 2021-12-11 DIAGNOSIS — D251 Intramural leiomyoma of uterus: Secondary | ICD-10-CM | POA: Diagnosis not present

## 2021-12-11 DIAGNOSIS — N905 Atrophy of vulva: Secondary | ICD-10-CM | POA: Diagnosis not present

## 2021-12-11 DIAGNOSIS — R140 Abdominal distension (gaseous): Secondary | ICD-10-CM | POA: Diagnosis not present

## 2021-12-11 DIAGNOSIS — D219 Benign neoplasm of connective and other soft tissue, unspecified: Secondary | ICD-10-CM | POA: Diagnosis not present

## 2021-12-11 DIAGNOSIS — N952 Postmenopausal atrophic vaginitis: Secondary | ICD-10-CM | POA: Diagnosis not present

## 2021-12-27 DIAGNOSIS — E785 Hyperlipidemia, unspecified: Secondary | ICD-10-CM | POA: Diagnosis not present

## 2021-12-27 DIAGNOSIS — I10 Essential (primary) hypertension: Secondary | ICD-10-CM | POA: Diagnosis not present

## 2021-12-27 DIAGNOSIS — R140 Abdominal distension (gaseous): Secondary | ICD-10-CM | POA: Diagnosis not present

## 2021-12-27 DIAGNOSIS — R350 Frequency of micturition: Secondary | ICD-10-CM | POA: Diagnosis not present

## 2021-12-27 DIAGNOSIS — E1169 Type 2 diabetes mellitus with other specified complication: Secondary | ICD-10-CM | POA: Diagnosis not present

## 2022-02-24 DIAGNOSIS — E1169 Type 2 diabetes mellitus with other specified complication: Secondary | ICD-10-CM | POA: Diagnosis not present

## 2022-02-24 DIAGNOSIS — R140 Abdominal distension (gaseous): Secondary | ICD-10-CM | POA: Diagnosis not present

## 2022-02-24 DIAGNOSIS — I10 Essential (primary) hypertension: Secondary | ICD-10-CM | POA: Diagnosis not present

## 2022-03-24 ENCOUNTER — Other Ambulatory Visit: Payer: Self-pay | Admitting: Internal Medicine

## 2022-03-24 ENCOUNTER — Other Ambulatory Visit (HOSPITAL_COMMUNITY): Payer: Self-pay | Admitting: Radiology

## 2022-03-24 ENCOUNTER — Ambulatory Visit
Admission: RE | Admit: 2022-03-24 | Discharge: 2022-03-24 | Disposition: A | Discharge status: Home / Self Care | Payer: 59 | Source: Ambulatory Visit | Attending: Internal Medicine | Admitting: Internal Medicine

## 2022-03-24 DIAGNOSIS — I10 Essential (primary) hypertension: Secondary | ICD-10-CM | POA: Diagnosis not present

## 2022-03-24 DIAGNOSIS — R059 Cough, unspecified: Secondary | ICD-10-CM | POA: Diagnosis not present

## 2022-03-24 DIAGNOSIS — R058 Other specified cough: Secondary | ICD-10-CM

## 2022-03-24 DIAGNOSIS — R062 Wheezing: Secondary | ICD-10-CM

## 2022-03-24 DIAGNOSIS — Z8639 Personal history of other endocrine, nutritional and metabolic disease: Secondary | ICD-10-CM | POA: Diagnosis not present

## 2022-03-27 ENCOUNTER — Ambulatory Visit (HOSPITAL_COMMUNITY)
Admission: RE | Admit: 2022-03-27 | Discharge: 2022-03-27 | Disposition: A | Discharge status: Home / Self Care | Payer: 59 | Source: Ambulatory Visit | Attending: Internal Medicine | Admitting: Internal Medicine

## 2022-03-27 DIAGNOSIS — R058 Other specified cough: Secondary | ICD-10-CM | POA: Diagnosis not present

## 2022-03-27 DIAGNOSIS — R062 Wheezing: Secondary | ICD-10-CM | POA: Insufficient documentation

## 2022-03-27 LAB — PULMONARY FUNCTION TEST
DL/VA % pred: 115 %
DL/VA: 4.95 ml/min/mmHg/L
DLCO unc % pred: 77 %
DLCO unc: 14 ml/min/mmHg
FEF 25-75 Post: 2.24 L/sec
FEF 25-75 Pre: 0.83 L/sec
FEF2575-%Change-Post: 169 %
FEF2575-%Pred-Post: 127 %
FEF2575-%Pred-Pre: 47 %
FEV1-%Change-Post: 20 %
FEV1-%Pred-Post: 99 %
FEV1-%Pred-Pre: 82 %
FEV1-Post: 1.74 L
FEV1-Pre: 1.44 L
FEV1FVC-%Change-Post: 26 %
FEV1FVC-%Pred-Pre: 94 %
FEV6-%Change-Post: -3 %
FEV6-%Pred-Post: 85 %
FEV6-%Pred-Pre: 88 %
FEV6-Post: 1.85 L
FEV6-Pre: 1.92 L
FEV6FVC-%Pred-Post: 104 %
FEV6FVC-%Pred-Pre: 104 %
FVC-%Change-Post: -3 %
FVC-%Pred-Post: 82 %
FVC-%Pred-Pre: 85 %
FVC-Post: 1.85 L
FVC-Pre: 1.93 L
Post FEV1/FVC ratio: 94 %
Post FEV6/FVC ratio: 100 %
Pre FEV1/FVC ratio: 74 %
Pre FEV6/FVC Ratio: 100 %
RV % pred: 81 %
RV: 1.55 L
TLC % pred: 76 %
TLC: 3.52 L

## 2022-03-27 MED ORDER — ALBUTEROL SULFATE (2.5 MG/3ML) 0.083% IN NEBU
2.5000 mg | INHALATION_SOLUTION | Freq: Once | RESPIRATORY_TRACT | Status: AC
  Administered 2022-03-27: 2.5 mg via RESPIRATORY_TRACT

## 2022-03-30 DIAGNOSIS — H2513 Age-related nuclear cataract, bilateral: Secondary | ICD-10-CM | POA: Diagnosis not present

## 2022-03-30 DIAGNOSIS — B0052 Herpesviral keratitis: Secondary | ICD-10-CM | POA: Diagnosis not present

## 2022-03-30 DIAGNOSIS — H20011 Primary iridocyclitis, right eye: Secondary | ICD-10-CM | POA: Diagnosis not present

## 2022-03-31 DIAGNOSIS — H2513 Age-related nuclear cataract, bilateral: Secondary | ICD-10-CM | POA: Diagnosis not present

## 2022-03-31 DIAGNOSIS — B0052 Herpesviral keratitis: Secondary | ICD-10-CM | POA: Diagnosis not present

## 2022-03-31 DIAGNOSIS — H20011 Primary iridocyclitis, right eye: Secondary | ICD-10-CM | POA: Diagnosis not present

## 2022-03-31 DIAGNOSIS — E119 Type 2 diabetes mellitus without complications: Secondary | ICD-10-CM | POA: Diagnosis not present

## 2022-03-31 DIAGNOSIS — H4041X1 Glaucoma secondary to eye inflammation, right eye, mild stage: Secondary | ICD-10-CM | POA: Diagnosis not present

## 2022-04-01 DIAGNOSIS — E739 Lactose intolerance, unspecified: Secondary | ICD-10-CM | POA: Diagnosis not present

## 2022-04-01 DIAGNOSIS — R140 Abdominal distension (gaseous): Secondary | ICD-10-CM | POA: Diagnosis not present

## 2022-04-01 DIAGNOSIS — E1165 Type 2 diabetes mellitus with hyperglycemia: Secondary | ICD-10-CM | POA: Diagnosis not present

## 2022-04-07 DIAGNOSIS — E119 Type 2 diabetes mellitus without complications: Secondary | ICD-10-CM | POA: Diagnosis not present

## 2022-04-07 DIAGNOSIS — H4041X0 Glaucoma secondary to eye inflammation, right eye, stage unspecified: Secondary | ICD-10-CM | POA: Diagnosis not present

## 2022-04-07 DIAGNOSIS — B0052 Herpesviral keratitis: Secondary | ICD-10-CM | POA: Diagnosis not present

## 2022-04-07 DIAGNOSIS — H2513 Age-related nuclear cataract, bilateral: Secondary | ICD-10-CM | POA: Diagnosis not present

## 2022-04-07 DIAGNOSIS — H20011 Primary iridocyclitis, right eye: Secondary | ICD-10-CM | POA: Diagnosis not present

## 2022-04-11 DIAGNOSIS — E785 Hyperlipidemia, unspecified: Secondary | ICD-10-CM | POA: Diagnosis not present

## 2022-04-11 DIAGNOSIS — R140 Abdominal distension (gaseous): Secondary | ICD-10-CM | POA: Diagnosis not present

## 2022-04-11 DIAGNOSIS — I10 Essential (primary) hypertension: Secondary | ICD-10-CM | POA: Diagnosis not present

## 2022-04-11 DIAGNOSIS — E1169 Type 2 diabetes mellitus with other specified complication: Secondary | ICD-10-CM | POA: Diagnosis not present

## 2022-04-16 ENCOUNTER — Encounter: Payer: Self-pay | Admitting: Pulmonary Disease

## 2022-04-16 ENCOUNTER — Ambulatory Visit: Payer: 59 | Admitting: Pulmonary Disease

## 2022-04-16 VITALS — BP 118/70 | HR 80 | Temp 98.2°F | Ht 61.0 in | Wt 142.0 lb

## 2022-04-16 DIAGNOSIS — J4530 Mild persistent asthma, uncomplicated: Secondary | ICD-10-CM

## 2022-04-16 MED ORDER — BUDESONIDE-FORMOTEROL FUMARATE 160-4.5 MCG/ACT IN AERO: 2.0000 | INHALATION_SPRAY | Freq: Two times a day (BID) | RESPIRATORY_TRACT | 5 refills | Status: DC

## 2022-04-16 NOTE — Progress Notes (Signed)
? ? ?Subjective:  ? ?PATIENT ID: Cheryl Huang GENDER: female DOB: October 18, 1961, MRN: [id number] ? ? ?HPI ? ?Chief Complaint  ?Patient presents with  ? Follow-up  ?  Results of PFT  ? ? ?Reason for Visit: New consult for PFTs ? ?[name] is a 61 year old female never smoker with HTN, DM2, HLD who presents for PFT evaluation and yellow sputum. ? ?For many years she has bronchitis like symptoms with wheezing usually during colder weather. In th last five months she has had worsening sputum production that improves with clearing her throat however it recurs. Worsens when laying down. Has some wheezing. Denies coughing spells. Denies shortness of breath. When she completed PFTs she reports that it improved.  Denies childhood asthma. Denies nasal congestion. ? ?Social History: ?Never smoker ?Multiple family members with asthma ?Previously worked in varnish x 5 ? ?I have personally reviewed patient's past medical/family/social history, allergies, current medications. ? ?Past Medical History:  ?Diagnosis Date  ? Anemia   ? Diabetes mellitus without complication (HCC)   ? Hyperlipidemia   ? Hypertension   ?  ? ?Family History  ?Problem Relation Age of Onset  ? Asthma Other   ?  ? ?Social History  ? ?Occupational History  ? Not on file  ?Tobacco Use  ? Smoking status: Never  ? Smokeless tobacco: Never  ?Vaping Use  ? Vaping Use: Never used  ?Substance and Sexual Activity  ? Alcohol use: No  ? Drug use: No  ? Sexual activity: Not on file  ? ? ?Allergies  ?Allergen Reactions  ? Penicillins Rash  ?  ? ?Outpatient Medications Prior to Visit  ?Medication Sig Dispense Refill  ? acetaminophen (TYLENOL) 325 MG tablet Take 325 mg by mouth every 6 (six) hours as needed for mild pain.    ? acetaminophen (TYLENOL) 650 MG CR tablet Take 650 mg by mouth every 8 (eight) hours as needed (arthritis pain).    ? amLODipine (NORVASC) 10 MG tablet Take 10 mg by mouth daily.    ? atorvastatin (LIPITOR) 20 MG tablet Take 20 mg by mouth  daily.    ? Cholecalciferol (D3 ADULT PO) Take 1,000 Int'l Units by mouth daily.    ? indapamide (LOZOL) 1.25 MG tablet Take 1.25 mg by mouth daily.    ? metFORMIN (GLUCOPHAGE) 500 MG tablet Take 500 mg by mouth daily.     ? Multiple Vitamin (MULTIVITAMIN WITH MINERALS) TABS tablet Take 1 tablet by mouth daily.    ? oxybutynin (DITROPAN-XL) 10 MG 24 hr tablet Take 10 mg by mouth at bedtime.    ? pantoprazole (PROTONIX) 40 MG tablet Take 40 mg by mouth daily.    ? traMADol (ULTRAM) 50 MG tablet Take 1-2 tablets (50-100 mg total) by mouth every 6 (six) hours as needed. 15 tablet 0  ? sucralfate (CARAFATE) 1 g tablet Take 1 g by mouth daily.    ? ?No facility-administered medications prior to visit.  ? ? ?Review of Systems  ?Constitutional:  Negative for chills, diaphoresis, fever, malaise/fatigue and weight loss.  ?HENT:  Negative for congestion.   ?Respiratory:  Positive for sputum production and wheezing. Negative for cough, hemoptysis and shortness of breath.   ?Cardiovascular:  Negative for chest pain, palpitations and leg swelling.  ? ? ?Objective:  ? ?Vitals:  ? 04/16/22 1055  ?BP: 118/70  ?Pulse: 80  ?Temp: 98.2 ?F (36.8 ?C)  ?TempSrc: Oral  ?SpO2: 100%  ?Weight: 142 lb (64.4 kg)  ?Height:  5\' 1"  (1.549 m)  ? ?SpO2: 100 % (RA) ? ?Physical Exam: ?General: Well-appearing, no acute distress ?HENT: [place], AT ?Eyes: EOMI, no scleral icterus ?Respiratory: Clear to auscultation bilaterally.  No crackles, wheezing or rales ?Cardiovascular: RRR, -M/R/G, no JVD ?Extremities:-Edema,-tenderness ?Neuro: AAO x4, CNII-XII grossly intact ?Psych: Normal mood, normal affect ? ?Data Reviewed: ? ?Imaging: ?CXR 03/24/22 - no acute process ? ?PFT: ?03/27/22 ?FVC 1.85 (82%) FEV1 1.74 (99%) Ratio 74  TLC 76% DLCO 77% ?Interpretation: No obstructive defect on spirometry however significant bronchodilator response in FEV1 indicative of asthma. Co-comitant mild restrictive defect with mild DLCO. ? ?Labs: ?CBC ?   ?Component Value Date/Time  ?  WBC 6.3 08/12/2020 0943  ? RBC 4.43 08/12/2020 0943  ? HGB 11.7 (L) 08/12/2020 0943  ? HCT 36.8 08/12/2020 0943  ? PLT 352 08/12/2020 0943  ? MCV 83.1 08/12/2020 0943  ? MCH 26.4 08/12/2020 0943  ? MCHC 31.8 08/12/2020 0943  ? RDW 13.7 08/12/2020 0943  ? ? ?Assessment & Plan:  ? ?Discussion: ?61 year old female never smoker with HTN, DM2, HLD who presents for PFT evaluation and yellow sputum production. We reviewed PFTs with significant BD response suggestive of asthma. Possible restrictive defect through suspect this may be related to effort +/- obstruction from asthma. Discussed clinical course and management of asthma including bronchodilator regimen and action plan for exacerbation. ? ?Mild persistent asthma ?--START Symbicort 160-4.5 mcg TWO puffs TWICE a day. Rinse out your mouth ? ?Asthma Action Plan ?Increase Symbicort to 2 puffs three times a day for worsening shortness of breath, wheezing and cough. If you symptoms do not improve in 24-48 hours, please our office for evaluation and/or prednisone taper. ? ?Co-comitant restrictive defect with mildly reduced DLCO ?--Chest imaging and in-clinic O2 normal ?--Suspect effort related. Will recheck PFTs in 6-12 months after asthma under control ? ? ?Health Maintenance ? ?There is no immunization history on file for this patient. ? ?No orders of the defined types were placed in this encounter. ? ?Meds ordered this encounter  ?Medications  ? budesonide-formoterol (SYMBICORT) 160-4.5 MCG/ACT inhaler  ?  Sig: Inhale 2 puffs into the lungs in the morning and at bedtime.  ?  Dispense:  1 each  ?  Refill:  5  ? ? ?Return in about 2 months (around 06/16/2022). ? ?I have spent a total time of 45-minutes on the day of the appointment reviewing prior documentation, coordinating care and discussing medical diagnosis and plan with the patient/family. Imaging, labs and tests included in this note have been reviewed and interpreted independently by me. ? ?Krystn Dermody 08/17/2022,  MD ?[facility] ?04/16/2022 7:44 PM  ?Office Number [phone] ? ? ?

## 2022-04-16 NOTE — Patient Instructions (Addendum)
Mild persistent asthma ?--START Symbicort 160-4.5 mcg TWO puffs TWICE a day. Rinse out your mouth ? ?Asthma Action Plan ?Increase Symbicort to 2 puffs three times a day for worsening shortness of breath, wheezing and cough. If you symptoms do not improve in 24-48 hours, please our office for evaluation and/or prednisone taper. ? ?Co-comitant restrictive defect with mildly reduced DLCO ?--Chest imaging and in-clinic O2 normal ?--Suspect effort related +/- asthma. Will recheck PFTs in 6-12 months after asthma under control ? ?Follow-up with me in July 2023 ?

## 2022-05-01 DIAGNOSIS — H20011 Primary iridocyclitis, right eye: Secondary | ICD-10-CM | POA: Diagnosis not present

## 2022-05-01 DIAGNOSIS — E119 Type 2 diabetes mellitus without complications: Secondary | ICD-10-CM | POA: Diagnosis not present

## 2022-05-01 DIAGNOSIS — B0052 Herpesviral keratitis: Secondary | ICD-10-CM | POA: Diagnosis not present

## 2022-05-01 DIAGNOSIS — H4041X0 Glaucoma secondary to eye inflammation, right eye, stage unspecified: Secondary | ICD-10-CM | POA: Diagnosis not present

## 2022-05-01 DIAGNOSIS — H2513 Age-related nuclear cataract, bilateral: Secondary | ICD-10-CM | POA: Diagnosis not present

## 2022-05-19 DIAGNOSIS — H20011 Primary iridocyclitis, right eye: Secondary | ICD-10-CM | POA: Diagnosis not present

## 2022-05-19 DIAGNOSIS — B0052 Herpesviral keratitis: Secondary | ICD-10-CM | POA: Diagnosis not present

## 2022-05-19 DIAGNOSIS — H4041X0 Glaucoma secondary to eye inflammation, right eye, stage unspecified: Secondary | ICD-10-CM | POA: Diagnosis not present

## 2022-05-20 DIAGNOSIS — E119 Type 2 diabetes mellitus without complications: Secondary | ICD-10-CM | POA: Insufficient documentation

## 2022-05-20 DIAGNOSIS — N3281 Overactive bladder: Secondary | ICD-10-CM | POA: Insufficient documentation

## 2022-05-20 DIAGNOSIS — K219 Gastro-esophageal reflux disease without esophagitis: Secondary | ICD-10-CM | POA: Diagnosis not present

## 2022-05-20 DIAGNOSIS — E1169 Type 2 diabetes mellitus with other specified complication: Secondary | ICD-10-CM | POA: Insufficient documentation

## 2022-05-20 DIAGNOSIS — Z1231 Encounter for screening mammogram for malignant neoplasm of breast: Secondary | ICD-10-CM | POA: Diagnosis not present

## 2022-05-20 DIAGNOSIS — R0982 Postnasal drip: Secondary | ICD-10-CM | POA: Diagnosis not present

## 2022-05-20 DIAGNOSIS — I152 Hypertension secondary to endocrine disorders: Secondary | ICD-10-CM | POA: Insufficient documentation

## 2022-05-20 DIAGNOSIS — E739 Lactose intolerance, unspecified: Secondary | ICD-10-CM | POA: Insufficient documentation

## 2022-05-20 DIAGNOSIS — J4530 Mild persistent asthma, uncomplicated: Secondary | ICD-10-CM | POA: Diagnosis not present

## 2022-05-20 DIAGNOSIS — E1159 Type 2 diabetes mellitus with other circulatory complications: Secondary | ICD-10-CM | POA: Diagnosis not present

## 2022-05-20 DIAGNOSIS — E785 Hyperlipidemia, unspecified: Secondary | ICD-10-CM | POA: Diagnosis not present

## 2022-06-02 DIAGNOSIS — Z1231 Encounter for screening mammogram for malignant neoplasm of breast: Secondary | ICD-10-CM | POA: Diagnosis not present

## 2022-06-17 DIAGNOSIS — L237 Allergic contact dermatitis due to plants, except food: Secondary | ICD-10-CM | POA: Diagnosis not present

## 2022-06-22 DIAGNOSIS — M1612 Unilateral primary osteoarthritis, left hip: Secondary | ICD-10-CM | POA: Diagnosis not present

## 2022-06-24 ENCOUNTER — Telehealth: Payer: Self-pay

## 2022-06-24 ENCOUNTER — Encounter: Payer: Self-pay | Admitting: Pulmonary Disease

## 2022-06-24 ENCOUNTER — Ambulatory Visit: Payer: 59 | Admitting: Pulmonary Disease

## 2022-06-24 VITALS — BP 160/70 | HR 86 | Temp 98.0°F | Ht 61.0 in | Wt 143.0 lb

## 2022-06-24 DIAGNOSIS — J4530 Mild persistent asthma, uncomplicated: Secondary | ICD-10-CM

## 2022-06-24 MED ORDER — BUDESONIDE-FORMOTEROL FUMARATE 160-4.5 MCG/ACT IN AERO: 2.0000 | INHALATION_SPRAY | Freq: Two times a day (BID) | RESPIRATORY_TRACT | 8 refills | Status: DC

## 2022-06-24 NOTE — Progress Notes (Signed)
Subjective:   PATIENT ID: [name] GENDER: female DOB: 02-17-1961, MRN: [id number]   HPI  Chief Complaint  Patient presents with   Follow-up    Patient feels like her breathing is doing good. States PCP put her on fluticasone and that with the inhaler has helped her.     Reason for Visit: Follow-up asthma  Ms. Kyleigh Nannini is a 61 year old female never smoker with HTN, DM2, HLD, hx CSF shunt in 1999 who presents for follow-up  Initial consult For many years she has bronchitis like symptoms with wheezing usually during colder weather. In th last five months she has had worsening sputum production that improves with clearing her throat however it recurs. Worsens when laying down. Has some wheezing. Denies coughing spells. Denies shortness of breath. When she completed PFTs she reports that it improved.  Denies childhood asthma. Denies nasal congestion.  06/24/22 Since starting her Symbicort and also starting fluticasone nasal spray she reports improved symptoms. No longer having productive cough except for morning sputum. No longer wheezing. Denies shortness of breath. Her activity is limited due to plans for left hip replacement. But able to ambulate within the house and perform ADLs. Has difficulty with long distances such as walking to the grocery store.  Asthma Control Test ACT Total Score  06/24/2022  9:55 AM 25   Social History: Never smoker Multiple family members with asthma Previously worked in varnish x 5  Past Medical History:  Diagnosis Date   Anemia    Diabetes mellitus without complication (HCC)    Hyperlipidemia    Hypertension      Family History  Problem Relation Age of Onset   Asthma Other      Social History   Occupational History   Not on file  Tobacco Use   Smoking status: Never   Smokeless tobacco: Never  Vaping Use   Vaping Use: Never used  Substance and Sexual Activity   Alcohol use: No   Drug use: No   Sexual activity: Not on  file    Allergies  Allergen Reactions   Penicillins Rash     Outpatient Medications Prior to Visit  Medication Sig Dispense Refill   acetaminophen (TYLENOL) 325 MG tablet Take 325 mg by mouth every 6 (six) hours as needed for mild pain.     acetaminophen (TYLENOL) 650 MG CR tablet Take 650 mg by mouth every 8 (eight) hours as needed (arthritis pain).     amLODipine (NORVASC) 10 MG tablet Take 10 mg by mouth daily.     atorvastatin (LIPITOR) 20 MG tablet Take 20 mg by mouth daily.     Cholecalciferol (D3 ADULT PO) Take 1,000 Int'l Units by mouth daily.     fluticasone (FLONASE) 50 MCG/ACT nasal spray Place 2 sprays into both nostrils in the morning and at bedtime.     indapamide (LOZOL) 1.25 MG tablet Take 1.25 mg by mouth daily.     metFORMIN (GLUCOPHAGE) 500 MG tablet Take 500 mg by mouth daily.      Multiple Vitamin (MULTIVITAMIN WITH MINERALS) TABS tablet Take 1 tablet by mouth daily.     oxybutynin (DITROPAN-XL) 10 MG 24 hr tablet Take 10 mg by mouth at bedtime.     pantoprazole (PROTONIX) 40 MG tablet Take 40 mg by mouth daily.     sucralfate (CARAFATE) 1 g tablet Take 1 g by mouth daily.     traMADol (ULTRAM) 50 MG tablet Take 1-2 tablets (50-100 mg total)  by mouth every 6 (six) hours as needed. 15 tablet 0   budesonide-formoterol (SYMBICORT) 160-4.5 MCG/ACT inhaler Inhale 2 puffs into the lungs in the morning and at bedtime. 1 each 5   No facility-administered medications prior to visit.    Review of Systems  Constitutional:  Negative for chills, diaphoresis, fever, malaise/fatigue and weight loss.  HENT:  Negative for congestion.   Respiratory:  Positive for cough. Negative for hemoptysis, sputum production, shortness of breath and wheezing.   Cardiovascular:  Negative for chest pain, palpitations and leg swelling.     Objective:   Vitals:   06/24/22 0959  BP: (!) 160/70  Pulse: 86  Temp: 98 F (36.7 C)  TempSrc: Oral  SpO2: 96%  Weight: 143 lb (64.9 kg)   Height: 5\' 1"  (1.549 m)   SpO2: 96 % O2 Device: None (Room air)  Physical Exam: General: Well-appearing, no acute distress HENT: , AT Eyes: EOMI, no scleral icterus Respiratory: Clear to auscultation bilaterally.  No crackles, wheezing or rales Cardiovascular: RRR, -M/R/G, no JVD Extremities:-Edema,-tenderness Neuro: AAO x4, CNII-XII grossly intact Psych: Normal mood, normal affect  Data Reviewed:  Imaging: CXR 03/24/22 - no acute process  PFT: 03/27/22 FVC 1.85 (82%) FEV1 1.74 (99%) Ratio 74  TLC 76% DLCO 77% Interpretation: No obstructive defect on spirometry however significant bronchodilator response in FEV1 indicative of asthma. Co-comitant mild restrictive defect with mild DLCO.  Labs: CBC    Component Value Date/Time   WBC 6.3 08/12/2020 0943   RBC 4.43 08/12/2020 0943   HGB 11.7 (L) 08/12/2020 0943   HCT 36.8 08/12/2020 0943   PLT 352 08/12/2020 0943   MCV 83.1 08/12/2020 0943   MCH 26.4 08/12/2020 0943   MCHC 31.8 08/12/2020 0943   RDW 13.7 08/12/2020 0943    Assessment & Plan:   Discussion: 61 year old female never smoker with asthma, HTN, DM2, HLD, hx CSF shunt in 1999 who presents for follow-up. Discussed clinical course and management of asthma including bronchodilator regimen and action plan for exacerbation. Discussed mildly reduced DLCO and plan to repeat PFTs one year from last testing date.  Mild persistent asthma - well-controlled --CONTINUE Symbicort 160-4.5 mcg TWO puffs TWICE a day. Rinse out your mouth --If symptoms remain controlled at next visit, consider reducing ICS  Asthma Action Plan Increase Symbicort to 2 puffs three times a day for worsening shortness of breath, wheezing and cough. If you symptoms do not improve in 24-48 hours, please our office for evaluation and/or prednisone taper.  Co-comitant restrictive defect with mildly reduced DLCO --Chest imaging and in-clinic O2 normal --Possible restrictive defect through suspect this  may be related to effort +/- obstruction from asthma.  --Suspect effort related. Will recheck PFTs in 6-12 months after asthma under control --ORDER pulmonary function tests in April 2024   Health Maintenance Immunization History  Administered Date(s) Administered   PFIZER Comirnaty(Gray Top)Covid-19 Tri-Sucrose Vaccine 06/22/2020, 07/13/2020   Zoster Recombinat (Shingrix) 03/22/2021, 06/14/2021    Orders Placed This Encounter  Procedures   Pulmonary Function Test    Standing Status:   Future    Standing Expiration Date:   06/25/2023    Scheduling Instructions:     April 2024    Order Specific Question:   Where should this test be performed?    Answer:   [facility]    Order Specific Question:   Full PFT: includes the following: basic spirometry, spirometry pre & post bronchodilator, diffusion capacity (DLCO), lung volumes  Answer:   Full PFT   Meds ordered this encounter  Medications   budesonide-formoterol (SYMBICORT) 160-4.5 MCG/ACT inhaler    Sig: Inhale 2 puffs into the lungs in the morning and at bedtime.    Dispense:  1 each    Refill:  8    Return in about 9 months (around 03/26/2023).  I have spent a total time of 30-minutes on the day of the appointment including chart review, data review, collecting history, coordinating care and discussing medical diagnosis and plan with the patient/family. Past medical history, allergies, medications were reviewed. Pertinent imaging, labs and tests included in this note have been reviewed and interpreted independently by me.  Kenny Stern Mechele Collin, MD [facility] 06/24/2022 12:03 PM  Office Number [phone]

## 2022-06-24 NOTE — Telephone Encounter (Signed)
Called and left voicemail for patient that I placed a recall letter for her with Dr Everardo All for Cheryl Huang of next year. And that she should be getting a letter in the mail in regards to scheduling for office visit and her PFTs. Nothing further needed

## 2022-06-24 NOTE — Patient Instructions (Addendum)
Mild persistent asthma - well-controlled --CONTINUE Symbicort 160-4.5 mcg TWO puffs TWICE a day. Rinse out your mouth  Asthma Action Plan Increase Symbicort to 2 puffs three times a day for worsening shortness of breath, wheezing and cough. If you symptoms do not improve in 24-48 hours, please our office for evaluation and/or prednisone taper.  Co-comitant restrictive defect with mildly reduced DLCO --Chest imaging and in-clinic O2 normal --Possible restrictive defect through suspect this may be related to effort +/- obstruction from asthma.  --Suspect effort related. Will recheck PFTs in 6-12 months after asthma under control --ORDER pulmonary function tests in April 2024  Follow-up with me in 9 months (April) with PFTs prior to visit

## 2022-06-24 NOTE — Telephone Encounter (Signed)
-----   Message from Chi Mechele Collin, MD sent at 06/24/2022 12:03 PM EDT ----- Regarding: Recall Please place recall for Cheryl Huang 2024. Need to schedule with PFTs prior to visit

## 2022-06-30 DIAGNOSIS — M1612 Unilateral primary osteoarthritis, left hip: Secondary | ICD-10-CM | POA: Diagnosis not present

## 2022-09-04 DIAGNOSIS — J029 Acute pharyngitis, unspecified: Secondary | ICD-10-CM | POA: Diagnosis not present

## 2022-09-04 DIAGNOSIS — Z8639 Personal history of other endocrine, nutritional and metabolic disease: Secondary | ICD-10-CM | POA: Diagnosis not present

## 2022-09-11 DIAGNOSIS — H20011 Primary iridocyclitis, right eye: Secondary | ICD-10-CM | POA: Diagnosis not present

## 2022-09-11 DIAGNOSIS — E119 Type 2 diabetes mellitus without complications: Secondary | ICD-10-CM | POA: Diagnosis not present

## 2022-09-11 DIAGNOSIS — H4041X0 Glaucoma secondary to eye inflammation, right eye, stage unspecified: Secondary | ICD-10-CM | POA: Diagnosis not present

## 2022-09-11 DIAGNOSIS — H2513 Age-related nuclear cataract, bilateral: Secondary | ICD-10-CM | POA: Diagnosis not present

## 2022-09-11 DIAGNOSIS — B0052 Herpesviral keratitis: Secondary | ICD-10-CM | POA: Diagnosis not present

## 2022-12-04 IMAGING — DX DG CHEST 2V
2 series · 2 of 2 positions shown · non-contrast
Comparison: 03/09/2016

CLINICAL DATA: 61-year-old female with productive cough

EXAM:
CHEST - 2 VIEW

[dg chest 2 view (1 of 2)]
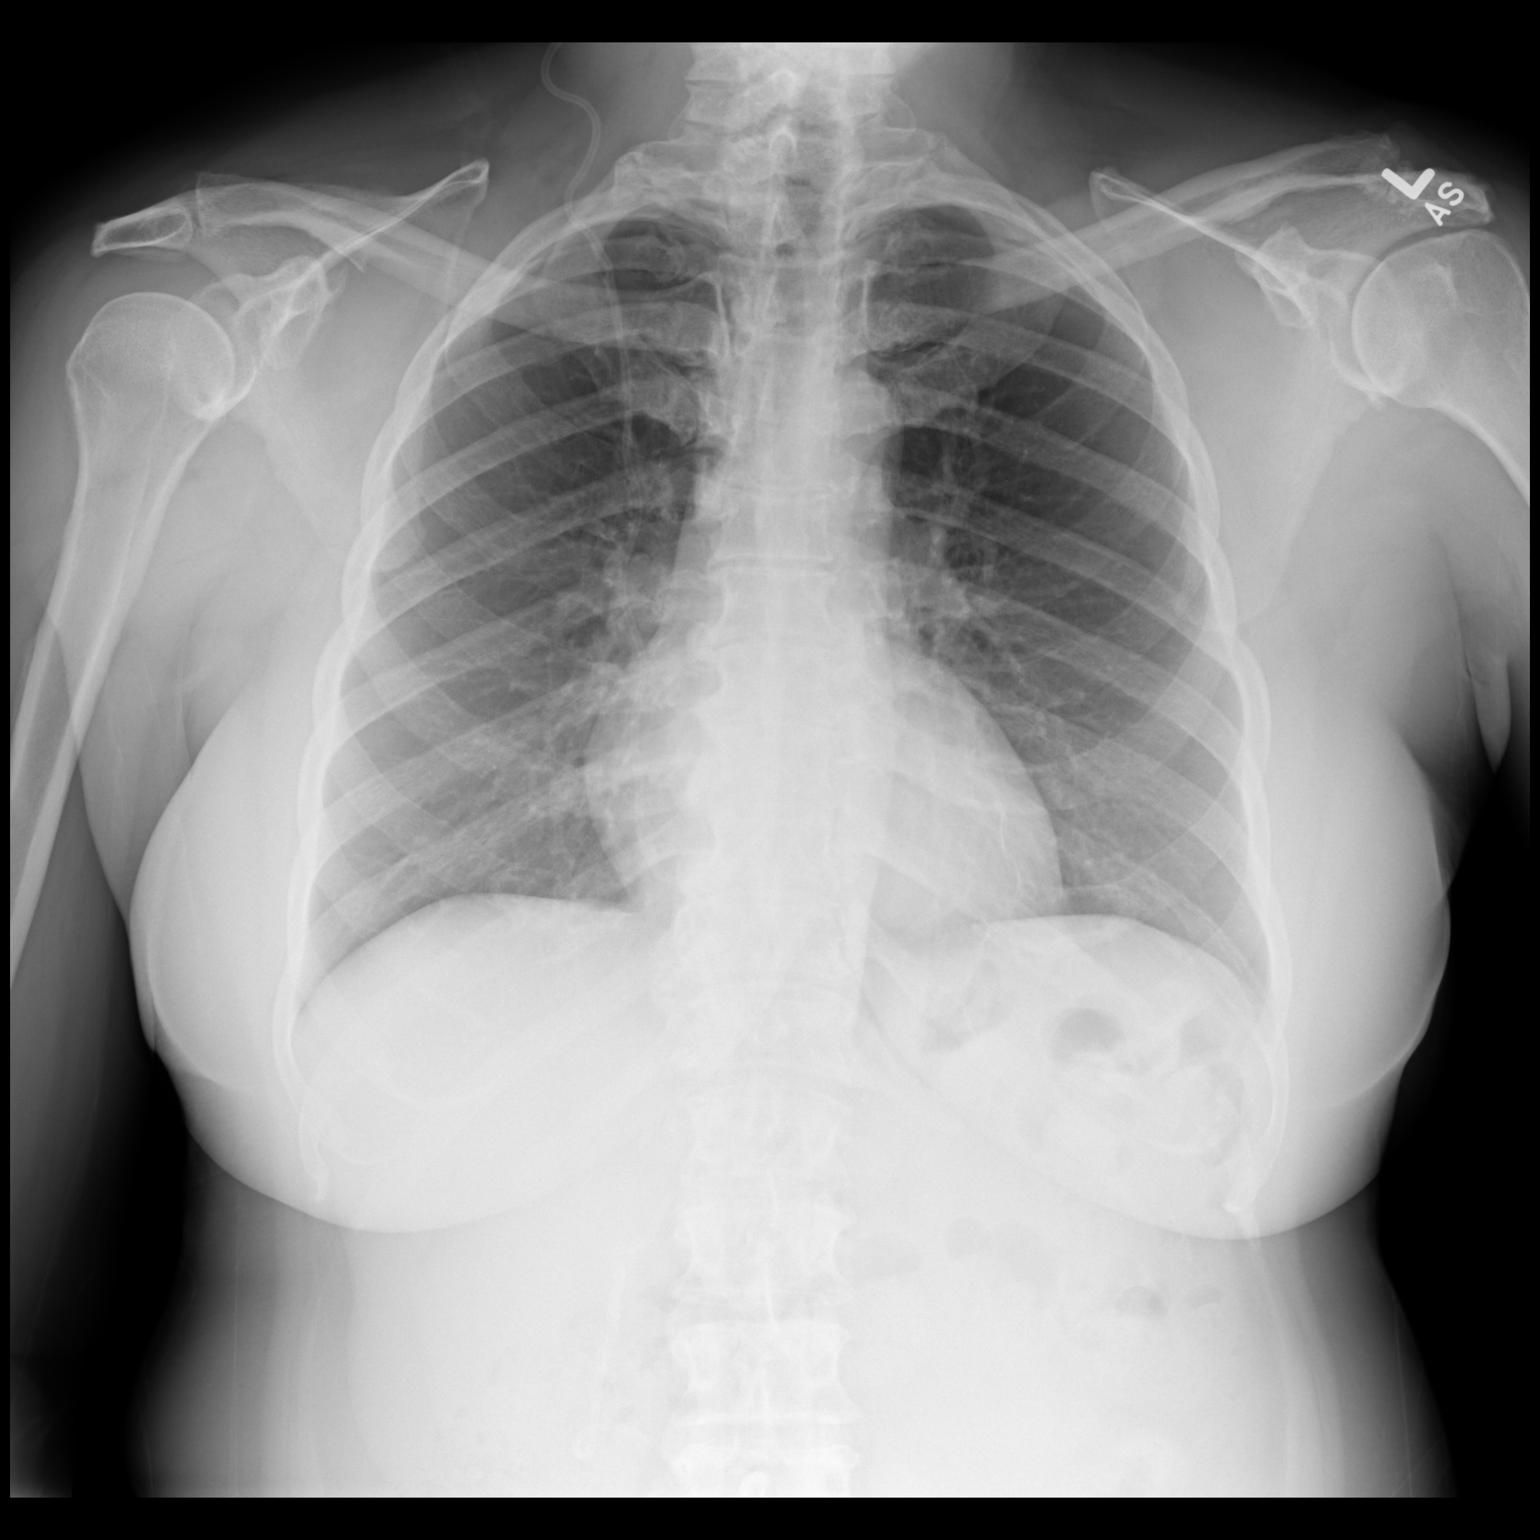

[dg chest 2 view (2 of 2)]
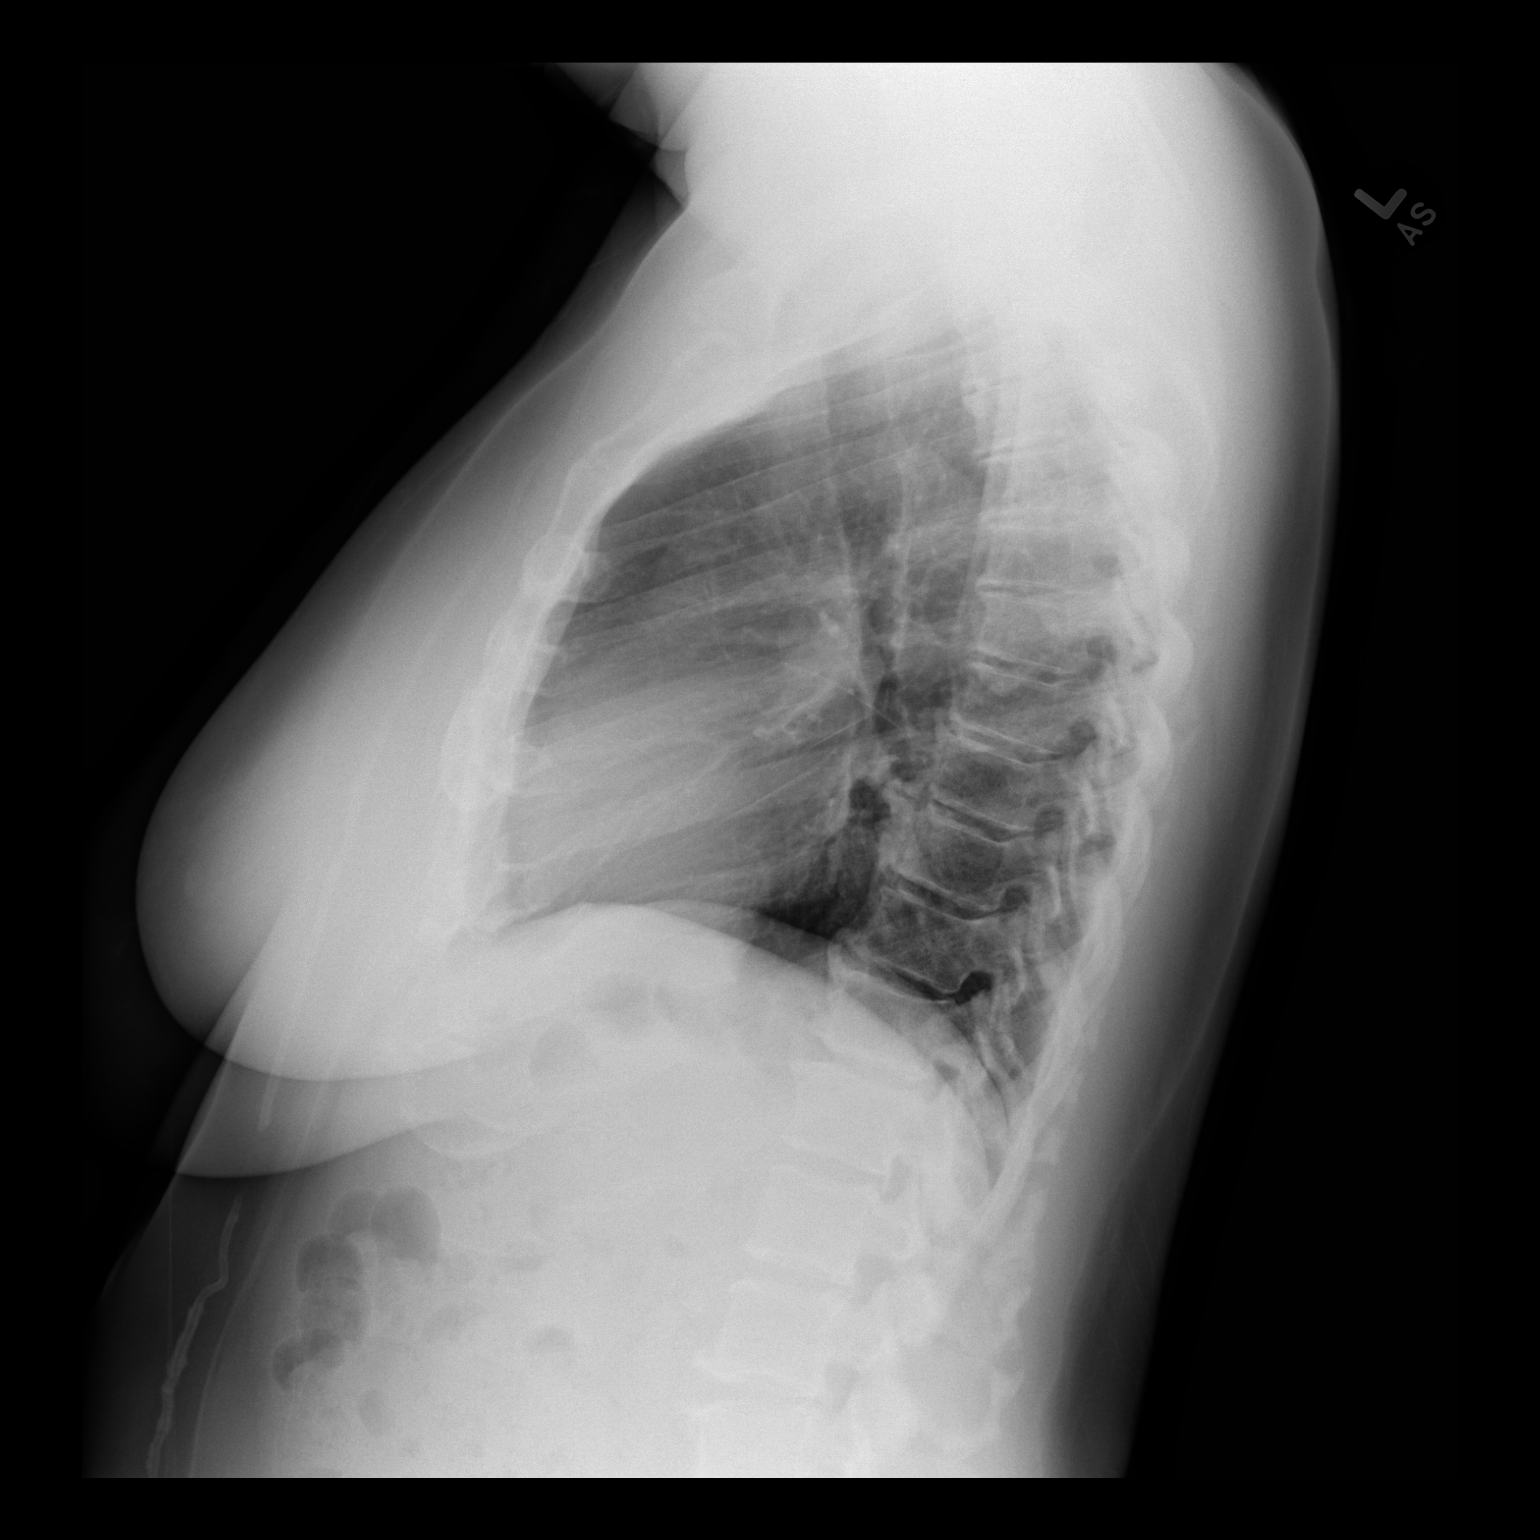

[2 of 2 positions shown; findings below may reference images not displayed]

FINDINGS: Cardiomediastinal silhouette unchanged in size and contour. No
evidence of central vascular congestion. No interlobular septal
thickening.

No pneumothorax or pleural effusion. Coarsened interstitial
markings, with no confluent airspace disease.

No acute displaced fracture. Degenerative changes of the spine.

Shunt tubing projects over the right chest and abdomen,
discontinuous within the segments of the upper abdomen
IMPRESSION: No active cardiopulmonary disease.

## 2022-12-29 ENCOUNTER — Other Ambulatory Visit: Payer: Self-pay | Admitting: Gastroenterology

## 2022-12-29 DIAGNOSIS — R1011 Right upper quadrant pain: Secondary | ICD-10-CM

## 2023-01-01 ENCOUNTER — Ambulatory Visit
Admission: RE | Admit: 2023-01-01 | Discharge: 2023-01-01 | Disposition: A | Discharge status: Home / Self Care | Payer: Medicaid Other | Source: Ambulatory Visit | Attending: Gastroenterology | Admitting: Gastroenterology

## 2023-01-01 DIAGNOSIS — R1011 Right upper quadrant pain: Secondary | ICD-10-CM

## 2023-02-12 ENCOUNTER — Telehealth: Payer: Self-pay

## 2023-02-12 NOTE — Telephone Encounter (Signed)
Called and left message for patient to call office back to schedule follow up visit with either Dr Loanne Drilling or a NP to get her cleared for her knee surgery.

## 2023-02-24 ENCOUNTER — Telehealth: Payer: Self-pay | Admitting: Pulmonary Disease

## 2023-02-24 NOTE — Telephone Encounter (Signed)
Fax received from Dr. Caprice Kluver with [facility] to perform a Left Total Knee Replacement on patient.  Patient needs surgery clearance. Surgery is Pending. Patient was seen on 06/24/2022. Office protocol is a risk assessment can be sent to surgeon if patient has been seen in 60 days or less.   Sending to Dr Loanne Drilling for risk assessment or recommendations if patient needs to be seen in office prior to surgical procedure.    Patient has office visit with Dr Loanne Drilling on 03/17/2023

## 2023-03-01 NOTE — Telephone Encounter (Signed)
Thank you for the update!

## 2023-03-17 ENCOUNTER — Ambulatory Visit (HOSPITAL_BASED_OUTPATIENT_CLINIC_OR_DEPARTMENT_OTHER): Payer: Medicaid Other | Admitting: Pulmonary Disease

## 2023-03-17 ENCOUNTER — Ambulatory Visit (INDEPENDENT_AMBULATORY_CARE_PROVIDER_SITE_OTHER): Payer: Medicaid Other | Admitting: Pulmonary Disease

## 2023-03-17 ENCOUNTER — Encounter (HOSPITAL_BASED_OUTPATIENT_CLINIC_OR_DEPARTMENT_OTHER): Payer: Self-pay | Admitting: Pulmonary Disease

## 2023-03-17 VITALS — BP 150/40 | HR 98 | Ht 61.0 in | Wt 145.6 lb

## 2023-03-17 DIAGNOSIS — J984 Other disorders of lung: Secondary | ICD-10-CM | POA: Diagnosis not present

## 2023-03-17 DIAGNOSIS — J4530 Mild persistent asthma, uncomplicated: Secondary | ICD-10-CM

## 2023-03-17 LAB — PULMONARY FUNCTION TEST
DL/VA % pred: 137 %
DL/VA: 5.87 ml/min/mmHg/L
DLCO cor % pred: 82 %
DLCO cor: 14.81 ml/min/mmHg
DLCO unc % pred: 82 %
DLCO unc: 14.81 ml/min/mmHg
FEF 25-75 Post: 1.22 L/sec
FEF 25-75 Pre: 2.26 L/sec
FEF2575-%Change-Post: -45 %
FEF2575-%Pred-Post: 60 %
FEF2575-%Pred-Pre: 112 %
FEV1-%Change-Post: -12 %
FEV1-%Pred-Post: 62 %
FEV1-%Pred-Pre: 71 %
FEV1-Post: 1.35 L
FEV1-Pre: 1.54 L
FEV1FVC-%Change-Post: -8 %
FEV1FVC-%Pred-Pre: 119 %
FEV6-%Change-Post: -5 %
FEV6-%Pred-Post: 56 %
FEV6-%Pred-Pre: 60 %
FEV6-Post: 1.55 L
FEV6-Pre: 1.64 L
FEV6FVC-%Pred-Post: 104 %
FEV6FVC-%Pred-Pre: 104 %
FVC-%Change-Post: -4 %
FVC-%Pred-Post: 56 %
FVC-%Pred-Pre: 59 %
FVC-Post: 1.59 L
FVC-Pre: 1.67 L
Post FEV1/FVC ratio: 85 %
Post FEV6/FVC ratio: 100 %
Pre FEV1/FVC ratio: 92 %
Pre FEV6/FVC Ratio: 100 %
RV % pred: 77 %
RV: 1.49 L
TLC % pred: 66 %
TLC: 3.05 L

## 2023-03-17 MED ORDER — FLUTICASONE-SALMETEROL 230-21 MCG/ACT IN AERO: 2.0000 | INHALATION_SPRAY | Freq: Two times a day (BID) | RESPIRATORY_TRACT | 5 refills | Status: DC

## 2023-03-17 NOTE — Patient Instructions (Signed)
Full PFT Performed Today  

## 2023-03-17 NOTE — Progress Notes (Signed)
Full PFT Performed Today  

## 2023-03-17 NOTE — Progress Notes (Signed)
Subjective:   PATIENT ID: Cheryl Huang GENDER: female DOB: 1961-07-21, MRN: [id number]   HPI  Chief Complaint  Patient presents with   Follow-up    PFT results    Reason for Visit: Follow-up asthma  Cheryl Huang is a 62 year old female never smoker with HTN, DM2, HLD, hx CSF shunt in 1999 who presents for follow-up  Initial consult For many years she has bronchitis like symptoms with wheezing usually during colder weather. In th last five months she has had worsening sputum production that improves with clearing her throat however it recurs. Worsens when laying down. Has some wheezing. Denies coughing spells. Denies shortness of breath. When she completed PFTs she reports that it improved.  Denies childhood asthma. Denies nasal congestion.  06/24/22 Since starting her Symbicort and also starting fluticasone nasal spray she reports improved symptoms. No longer having productive cough except for morning sputum. No longer wheezing. Denies shortness of breath. Her activity is limited due to plans for left hip replacement. But able to ambulate within the house and perform ADLs. Has difficulty with long distances such as walking to the grocery store.  03/17/23 She is compliant with her Symbicort. No exacerbations since our last visit. Does feel short of breath at night. She has unchanged mild mucous productive that is unchanged. Some wheezing at night. Will feel choked up. This occurs nightly. During the day her symptoms are well controlled. Is planning for left hip surgery this summer.  Asthma Control Test ACT Total Score  03/17/2023 10:30 AM 15  06/24/2022  9:55 AM 25   Social History: Never smoker Multiple family members with asthma Previously worked in [employer] x 5  Past Medical History:  Diagnosis Date   Anemia    Diabetes mellitus without complication ([place])    Hyperlipidemia    Hypertension      Family History  Problem Relation Age of Onset   Asthma Other       Social History   Occupational History   Not on file  Tobacco Use   Smoking status: Never   Smokeless tobacco: Never  Vaping Use   Vaping Use: Never used  Substance and Sexual Activity   Alcohol use: No   Drug use: No   Sexual activity: Not on file    Allergies  Allergen Reactions   Penicillins Rash     Outpatient Medications Prior to Visit  Medication Sig Dispense Refill   acetaminophen (TYLENOL) 325 MG tablet Take 325 mg by mouth every 6 (six) hours as needed for mild pain.     acetaminophen (TYLENOL) 650 MG CR tablet Take 650 mg by mouth every 8 (eight) hours as needed (arthritis pain).     amLODipine (NORVASC) 10 MG tablet Take 10 mg by mouth daily.     atorvastatin (LIPITOR) 20 MG tablet Take 20 mg by mouth daily.     budesonide-formoterol (SYMBICORT) 160-4.5 MCG/ACT inhaler Inhale 2 puffs into the lungs in the morning and at bedtime. 1 each 8   Cholecalciferol (D3 ADULT PO) Take 1,000 Int'l Units by mouth daily.     fluticasone (FLONASE) 50 MCG/ACT nasal spray Place 2 sprays into both nostrils in the morning and at bedtime.     indapamide (LOZOL) 1.25 MG tablet Take 1.25 mg by mouth daily.     metFORMIN (GLUCOPHAGE) 500 MG tablet Take 500 mg by mouth daily.      Multiple Vitamin (MULTIVITAMIN WITH MINERALS) TABS tablet Take 1 tablet by mouth daily.  oxybutynin (DITROPAN-XL) 10 MG 24 hr tablet Take 10 mg by mouth at bedtime.     pantoprazole (PROTONIX) 40 MG tablet Take 40 mg by mouth daily.     sucralfate (CARAFATE) 1 g tablet Take 1 g by mouth daily.     traMADol (ULTRAM) 50 MG tablet Take 1-2 tablets (50-100 mg total) by mouth every 6 (six) hours as needed. 15 tablet 0   No facility-administered medications prior to visit.    Review of Systems  Constitutional:  Negative for chills, diaphoresis, fever, malaise/fatigue and weight loss.  HENT:  Negative for congestion.   Respiratory:  Positive for cough, sputum production, shortness of breath and wheezing.  Negative for hemoptysis.   Cardiovascular:  Negative for chest pain, palpitations and leg swelling.     Objective:   Vitals:   03/17/23 1052  BP: (!) 150/40  Pulse: 98  SpO2: 96%  Weight: 66 kg  Height: 5\' 1"  (1.549 m)      Physical Exam: General: Well-appearing, no acute distress HENT: [place], AT Eyes: EOMI, no scleral icterus Respiratory: Clear to auscultation bilaterally.  No crackles, wheezing or rales Cardiovascular: RRR, -M/R/G, no JVD Extremities:-Edema,-tenderness Neuro: AAO x4, CNII-XII grossly intact Psych: Normal mood, normal affect  Data Reviewed:  Imaging: CXR 03/24/22 - no acute process  PFT: 03/27/22 FVC 1.85 (82%) FEV1 1.74 (99%) Ratio 74  TLC 76% DLCO 77% Interpretation: No obstructive defect on spirometry however significant bronchodilator response in FEV1 indicative of asthma. Co-comitant mild restrictive defect with mild DLCO.  03/17/23 FVC 1.59 (56%) FEV1 1.35 (62%) Ratio 85  TLC 66% DLCO 82% Interpretation: Moderate restrictive defect with normal DLCO. No significant BD response   Labs: CBC    Component Value Date/Time   WBC 6.3 08/12/2020 0943   RBC 4.43 08/12/2020 0943   HGB 11.7 (L) 08/12/2020 0943   HCT 36.8 08/12/2020 0943   PLT 352 08/12/2020 0943   MCV 83.1 08/12/2020 0943   MCH 26.4 08/12/2020 0943   MCHC 31.8 08/12/2020 0943   RDW 13.7 08/12/2020 0943    Assessment & Plan:   Discussion: 62 year old female never smoker with asthma, HTN, DM2, hx CSF shunt in 1999 who presents for follow-up. Discussed clinical course and management of asthma including bronchodilator regimen and action plan for exacerbation.  Mild persistent asthma - not in exacerbation, breakthrough symptoms --START Advair HFA 230-21 mcg TWO puffs in the morning and evening --OK to use leftover Symbicort as needed until you run out  Asthma Action Plan Increase Symbicort to 2 puff as needed for worsening shortness of breath, wheezing and cough. If you symptoms do  not improve in 24-48 hours, please our office for evaluation and/or prednisone taper.  Co-comitant restrictive defect - likely effort related --Chest imaging and in-clinic O2 normal --Possible restrictive defect through suspect this may be related to effort +/- obstruction from asthma.  --Suspect effort related. Will recheck PFTs in 6-12 months after asthma under control --Reviewed PFTs. Restrictive defect remains present but DLCO has normalized  Peri-operative Assessment of Pulmonary Risk for Non-Thoracic Surgery (Left Hip Surgery)  For Ms. Bittel, risk of perioperative pulmonary complications is increased by:  [ ] Age greater than 65 years  [ ]  COPD  [ ] Serum albumin <3.5  [ ]  Smoking  [ ]  Obstructive sleep apnea  [ ]  NYHA Class II Pulmonary Hypertension  ARISCAT score: Low risk. 1.6% risk of in-hospital post-op pulmonary complications (composite including respiratory failure, respiratory infection, pleural effusion, atelectasis, pneumothorax, bronchospasm, aspiration  pneumonitis)  Respiratory complications generally occur in 1% of ASA Class I patients, 5% of ASA Class II and 10% of ASA Class III-IV patients These complications rarely result in mortality and include postoperative pneumonia, atelectasis, pulmonary embolism, ARDS and increased time requiring postoperative mechanical ventilation.  Overall, I recommend proceeding with the surgery if the risk for respiratory complications are outweighed by the potential benefits. This will need to be discussed between the patient and surgeon.  To reduce risks of respiratory complications, I recommend: --Pre- and post-operative incentive spirometry performed frequently while awake --Avoiding use of pancuronium during anesthesia.  I have discussed the risk factors and recommendations above with the patient.   Health Maintenance Immunization History  Administered Date(s) Administered   PFIZER Comirnaty(Gray Top)Covid-19 Tri-Sucrose  Vaccine 06/22/2020, 07/13/2020   Zoster Recombinat (Shingrix) 03/22/2021, 06/14/2021    No orders of the defined types were placed in this encounter.  Meds ordered this encounter  Medications   fluticasone-salmeterol (ADVAIR HFA) 230-21 MCG/ACT inhaler    Sig: Inhale 2 puffs into the lungs 2 (two) times daily.    Dispense:  1 each    Refill:  5    Brand name only per insurance    No follow-ups on file. For inhaler check and ensure ok before surgery in July.   I have spent a total time of 35-minutes on the day of the appointment including chart review, data review, collecting history, coordinating care and discussing medical diagnosis and plan with the patient/family. Past medical history, allergies, medications were reviewed. Pertinent imaging, labs and tests included in this note have been reviewed and interpreted independently by me.  [place], MD [facility] 03/17/2023 10:27 AM  Office Number [phone]

## 2023-03-17 NOTE — Patient Instructions (Addendum)
Mild persistent asthma - well-controlled --START Advair HFA 230-21 mcg TWO puffs in the morning and evening --OK to use leftover Symbicort as needed until you run out  Co-comitant restrictive defect  --Chest imaging and in-clinic O2 normal --Possible restrictive defect through suspect this may be related to effort +/- obstruction from asthma.  --Suspect effort related. Will recheck PFTs in 6-12 months after asthma under control --Reviewed PFTs. Restrictive defect remains present but DLCO has normalized

## 2023-03-18 NOTE — Telephone Encounter (Signed)
OV notes and clearance form have been faxed back to [facility]. Nothing further needed at this time.

## 2023-05-17 DIAGNOSIS — E559 Vitamin D deficiency, unspecified: Secondary | ICD-10-CM | POA: Insufficient documentation

## 2023-05-17 DIAGNOSIS — J3089 Other allergic rhinitis: Secondary | ICD-10-CM | POA: Insufficient documentation

## 2023-05-17 DIAGNOSIS — D509 Iron deficiency anemia, unspecified: Secondary | ICD-10-CM | POA: Insufficient documentation

## 2023-05-24 ENCOUNTER — Ambulatory Visit (HOSPITAL_BASED_OUTPATIENT_CLINIC_OR_DEPARTMENT_OTHER): Payer: Medicaid Other | Admitting: Pulmonary Disease

## 2023-05-24 ENCOUNTER — Encounter (HOSPITAL_BASED_OUTPATIENT_CLINIC_OR_DEPARTMENT_OTHER): Payer: Self-pay | Admitting: Pulmonary Disease

## 2023-05-24 VITALS — BP 152/72 | HR 90 | Temp 98.1°F | Ht 61.0 in | Wt 144.4 lb

## 2023-05-24 DIAGNOSIS — J4530 Mild persistent asthma, uncomplicated: Secondary | ICD-10-CM | POA: Diagnosis not present

## 2023-05-24 MED ORDER — FLUTICASONE-SALMETEROL 230-21 MCG/ACT IN AERO: 2.0000 | INHALATION_SPRAY | Freq: Two times a day (BID) | RESPIRATORY_TRACT | 5 refills | Status: DC

## 2023-05-24 NOTE — Patient Instructions (Addendum)
Mild persistent asthma - well-controlled, mild productive cough present --CONTINUE Advair HFA 230-21 mcg TWO puffs in the morning and evening. REFILLED 6 months --CONTINUE Singulair. HOWEVER if this not available, ok to take either zyrtec or claritin as directed  Asthma Action Plan Increase Symbicort to 2 puff as needed for worsening shortness of breath, wheezing and cough. If you symptoms do not improve in 24-48 hours, please our office for evaluation and/or prednisone taper.  Peri-operative Assessment of Pulmonary Risk for Non-Thoracic Surgery (Left Hip Surgery)  For Cheryl Huang, risk of perioperative pulmonary complications is increased by:  [ ] Age greater than 65 years  [ ]  COPD  [ ] Serum albumin <3.5  [ ]  Smoking  [ ]  Obstructive sleep apnea  [ ]  NYHA Class II Pulmonary Hypertension  ARISCAT score: Low risk. 1.6% risk of in-hospital post-op pulmonary complications (composite including respiratory failure, respiratory infection, pleural effusion, atelectasis, pneumothorax, bronchospasm, aspiration pneumonitis)  Respiratory complications generally occur in 1% of ASA Class I patients, 5% of ASA Class II and 10% of ASA Class III-IV patients These complications rarely result in mortality and include postoperative pneumonia, atelectasis, pulmonary embolism, ARDS and increased time requiring postoperative mechanical ventilation.  Overall, I recommend proceeding with the surgery if the risk for respiratory complications are outweighed by the potential benefits. This will need to be discussed between the patient and surgeon.  To reduce risks of respiratory complications, I recommend: --Pre- and post-operative incentive spirometry performed frequently while awake --Avoiding use of pancuronium during anesthesia.  I have discussed the risk factors and recommendations above with the patient.

## 2023-05-24 NOTE — Progress Notes (Signed)
Subjective:   PATIENT ID: Cheryl Huang GENDER: female DOB: 1961-03-23, MRN: [id number]   HPI  Chief Complaint  Patient presents with   Follow-up    Follow up. Patient states she is still dealing with mucus draining down her throat and she sometimes has coughing spells. She's getting better but mucus is still lingering.     Reason for Visit: Follow-up asthma  Cheryl Huang is a 62 year old female never smoker with HTN, DM2, HLD, hx CSF shunt in 1999 who presents for follow-up  Initial consult For many years she has bronchitis like symptoms with wheezing usually during colder weather. In th last five months she has had worsening sputum production that improves with clearing her throat however it recurs. Worsens when laying down. Has some wheezing. Denies coughing spells. Denies shortness of breath. When she completed PFTs she reports that it improved.  Denies childhood asthma. Denies nasal congestion.  06/24/22 Since starting her Symbicort and also starting fluticasone nasal spray she reports improved symptoms. No longer having productive cough except for morning sputum. No longer wheezing. Denies shortness of breath. Her activity is limited due to plans for left hip replacement. But able to ambulate within the house and perform ADLs. Has difficulty with long distances such as walking to the grocery store.  03/17/23 She is compliant with her Symbicort. No exacerbations since our last visit. Does feel short of breath at night. She has unchanged mild mucous productive that is unchanged. Some wheezing at night. Will feel choked up. This occurs nightly. During the day her symptoms are well controlled. Is planning for left hip surgery this summer.  05/24/23 She reports improved shortness of breath and wheezing but still having sensation of mucous in her throat. Will cough. She has been compliant with her Advair and flonase twice a day. Otherwise overall doing well. She believes she is on  singulair but it is not on her med list.  Asthma Control Test ACT Total Score  05/24/2023  8:45 AM 23  03/17/2023 10:30 AM 15  06/24/2022  9:55 AM 25   Social History: Never smoker Multiple family members with asthma Previously worked in [employer] x 5  Past Medical History:  Diagnosis Date   Anemia    Diabetes mellitus without complication ([facility])    Hyperlipidemia    Hypertension      Family History  Problem Relation Age of Onset   Asthma Other      Social History   Occupational History   Not on file  Tobacco Use   Smoking status: Never   Smokeless tobacco: Never  Vaping Use   Vaping Use: Never used  Substance and Sexual Activity   Alcohol use: No   Drug use: No   Sexual activity: Not on file    Allergies  Allergen Reactions   Penicillins Rash     Outpatient Medications Prior to Visit  Medication Sig Dispense Refill   acetaminophen (TYLENOL) 325 MG tablet Take 325 mg by mouth every 6 (six) hours as needed for mild pain.     acetaminophen (TYLENOL) 650 MG CR tablet Take 650 mg by mouth every 8 (eight) hours as needed (arthritis pain).     amLODipine (NORVASC) 10 MG tablet Take 10 mg by mouth daily.     atorvastatin (LIPITOR) 20 MG tablet Take 20 mg by mouth daily.     Cholecalciferol (D3 ADULT PO) Take 1,000 Int'l Units by mouth daily.     fluticasone (FLONASE) 50 MCG/ACT  nasal spray Place 2 sprays into both nostrils in the morning and at bedtime.     indapamide (LOZOL) 1.25 MG tablet Take 1.25 mg by mouth daily.     metFORMIN (GLUCOPHAGE) 500 MG tablet Take 500 mg by mouth daily.      Multiple Vitamin (MULTIVITAMIN WITH MINERALS) TABS tablet Take 1 tablet by mouth daily.     MYRBETRIQ 50 MG TB24 tablet Take 50 mg by mouth daily.     fluticasone-salmeterol (ADVAIR HFA) 230-21 MCG/ACT inhaler Inhale 2 puffs into the lungs 2 (two) times daily. 1 each 5   oxybutynin (DITROPAN-XL) 10 MG 24 hr tablet Take 10 mg by mouth at bedtime. (Patient not taking: Reported on  03/17/2023)     pantoprazole (PROTONIX) 40 MG tablet Take 40 mg by mouth daily. (Patient not taking: Reported on 03/17/2023)     sucralfate (CARAFATE) 1 g tablet Take 1 g by mouth daily. (Patient not taking: Reported on 03/17/2023)     traMADol (ULTRAM) 50 MG tablet Take 1-2 tablets (50-100 mg total) by mouth every 6 (six) hours as needed. (Patient not taking: Reported on 05/24/2023) 15 tablet 0   No facility-administered medications prior to visit.    Review of Systems  Constitutional:  Negative for chills, diaphoresis, fever, malaise/fatigue and weight loss.  HENT:  Negative for congestion.   Respiratory:  Positive for sputum production. Negative for cough, hemoptysis, shortness of breath and wheezing.   Cardiovascular:  Negative for chest pain, palpitations and leg swelling.     Objective:   Vitals:   05/24/23 0843  BP: (!) 152/72  Pulse: 90  Temp: 98.1 F (36.7 C)  TempSrc: Oral  SpO2: 98%  Weight: 144 lb 6.4 oz (65.5 kg)  Height: 5\' 1"  (1.549 m)    SpO2: 98 % O2 Device: None (Room air)  Physical Exam: General: Well-appearing, no acute distress HENT: [place], AT Eyes: EOMI, no scleral icterus Respiratory: Clear to auscultation bilaterally.  No crackles, wheezing or rales Cardiovascular: RRR, -M/R/G, no JVD Extremities:-Edema,-tenderness Neuro: AAO x4, CNII-XII grossly intact Psych: Normal mood, normal affect  Data Reviewed:  Imaging: CXR 03/24/22 - no acute process  PFT: 03/27/22 FVC 1.85 (82%) FEV1 1.74 (99%) Ratio 74  TLC 76% DLCO 77% Interpretation: No obstructive defect on spirometry however significant bronchodilator response in FEV1 indicative of asthma. Co-comitant mild restrictive defect with mild DLCO.  03/17/23 FVC 1.59 (56%) FEV1 1.35 (62%) Ratio 85  TLC 66% DLCO 82% Interpretation: Moderate restrictive defect with normal DLCO. No significant BD response   Labs: CBC    Component Value Date/Time   WBC 6.3 08/12/2020 0943   RBC 4.43 08/12/2020 0943   HGB  11.7 (L) 08/12/2020 0943   HCT 36.8 08/12/2020 0943   PLT 352 08/12/2020 0943   MCV 83.1 08/12/2020 0943   MCH 26.4 08/12/2020 0943   MCHC 31.8 08/12/2020 0943   RDW 13.7 08/12/2020 0943    Assessment & Plan:   Discussion: 62 year old female never smoker with asthma, HTN, DM2, hx CSF shunt in 1999 who presents for follow-up. Discussed clinical course and management of asthma including bronchodilator regimen, preventive care including vaccinations and action plan for exacerbation.  Mild persistent asthma - well-controlled, mild productive cough present --CONTINUE Advair HFA 230-21 mcg TWO puffs in the morning and evening. REFILLED 6 months --CONTINUE Singulair. HOWEVER if this not available, ok to take either zyrtec or claritin as directed  Asthma Action Plan Increase Symbicort to 2 puff as needed for worsening  shortness of breath, wheezing and cough. If you symptoms do not improve in 24-48 hours, please our office for evaluation and/or prednisone taper.  Co-comitant restrictive defect - likely effort related --Chest imaging and in-clinic O2 normal --Possible restrictive defect through suspect this may be related to effort +/- obstruction from asthma.  --Suspect effort related. Will recheck PFTs in 6-12 months after asthma under control --Reviewed prior  PFTs. Restrictive defect remains present but DLCO has normalized  Peri-operative Assessment of Pulmonary Risk for Non-Thoracic Surgery (Left Hip Surgery)  For Cheryl Huang, risk of perioperative pulmonary complications is increased by:  [ ] Age greater than 65 years  [ ]  COPD  [ ] Serum albumin <3.5  [ ]  Smoking  [ ]  Obstructive sleep apnea  [ ]  NYHA Class II Pulmonary Hypertension  ARISCAT score: Low risk. 1.6% risk of in-hospital post-op pulmonary complications (composite including respiratory failure, respiratory infection, pleural effusion, atelectasis, pneumothorax, bronchospasm, aspiration pneumonitis)  Respiratory  complications generally occur in 1% of ASA Class I patients, 5% of ASA Class II and 10% of ASA Class III-IV patients These complications rarely result in mortality and include postoperative pneumonia, atelectasis, pulmonary embolism, ARDS and increased time requiring postoperative mechanical ventilation.  Overall, I recommend proceeding with the surgery if the risk for respiratory complications are outweighed by the potential benefits. This will need to be discussed between the patient and surgeon.  To reduce risks of respiratory complications, I recommend: --Pre- and post-operative incentive spirometry performed frequently while awake --Avoiding use of pancuronium during anesthesia.  I have discussed the risk factors and recommendations above with the patient.   Health Maintenance Immunization History  Administered Date(s) Administered   PFIZER Comirnaty(Gray Top)Covid-19 Tri-Sucrose Vaccine 06/22/2020, 07/13/2020   PFIZER(Purple Top)SARS-COV-2 Vaccination 06/22/2020, 07/13/2020   PNEUMOCOCCAL CONJUGATE-20 11/19/2022   Tdap 12/16/2022   Zoster Recombinat (Shingrix) 03/22/2021, 06/14/2021    No orders of the defined types were placed in this encounter.  Meds ordered this encounter  Medications   fluticasone-salmeterol (ADVAIR HFA) 230-21 MCG/ACT inhaler    Sig: Inhale 2 puffs into the lungs 2 (two) times daily.    Dispense:  1 each    Refill:  5    Brand name only per insurance    Return in about 6 months (around 11/23/2023).  I have spent a total time of 20-minutes on the day of the appointment including chart review, data review, collecting history, coordinating care and discussing medical diagnosis and plan with the patient/family. Past medical history, allergies, medications were reviewed. Pertinent imaging, labs and tests included in this note have been reviewed and interpreted independently by me.  Etrulia Zarr Mechele Col[place], MD [facility] 05/24/2023 9:02 AM   Office Number [phone]

## 2023-06-21 NOTE — H&P (Signed)
HIP ARTHROPLASTY ADMISSION H&P  Patient ID: Cheryl Huang MRN: [id number] DOB/AGE: 64-Apr-1962 64 y.o.  Chief Complaint: left hip pain.  Planned Procedure Date: 07/13/23 Medical and Cardiac Clearance by Dr. Kerry Dory   Pulmonary clearance by Dr. Everardo All   HPI: Cheryl Huang is a 62 y.o. female who presents for evaluation of OA LEFT HIP. The patient has a history of pain and functional disability in the left hip due to arthritis and has failed non-surgical conservative treatments for greater than 12 weeks to include NSAID's and/or analgesics, corticosteriod injections, and activity modification.  Onset of symptoms was gradual, starting 2 years ago with gradually worsening course since that time. The patient noted no past surgery on the left hip.  Patient currently rates pain at 9 out of 10 with activity. Patient has night pain, worsening of pain with activity and weight bearing, and pain that interferes with activities of daily living.  Patient has evidence of subchondral sclerosis, periarticular osteophytes, and joint space narrowing by imaging studies.  There is no active infection.  Past Medical History:  Diagnosis Date   Anemia    Diabetes mellitus without complication (HCC)    Hyperlipidemia    Hypertension    Past Surgical History:  Procedure Laterality Date   CSF SHUNT     states had shunt for her brain 17 yrs ago   PARATHYROIDECTOMY Right 08/15/2020   Procedure: RIGHT PARATHYROIDECTOMY;  Surgeon: Darnell Level, MD;  Location: [facility];  Service: General;  Laterality: Right;   Allergies  Allergen Reactions   Penicillins Rash   Prior to Admission medications   Medication Sig Start Date End Date Taking? Authorizing Provider  acetaminophen (TYLENOL) 325 MG tablet Take 325 mg by mouth every 6 (six) hours as needed for mild pain.    [provider]  acetaminophen (TYLENOL) 650 MG CR tablet Take 650 mg by mouth every 8 (eight) hours as needed (arthritis pain).    [provider]  amLODipine (NORVASC) 10 MG tablet Take 10 mg by mouth daily.    [provider]  atorvastatin (LIPITOR) 20 MG tablet Take 20 mg by mouth daily.    [provider]  Cholecalciferol (D3 ADULT PO) Take 1,000 Int'l Units by mouth daily.    [provider]  fluticasone (FLONASE) 50 MCG/ACT nasal spray Place 2 sprays into both nostrils in the morning and at bedtime. 06/22/22   [provider]  fluticasone-salmeterol (ADVAIR HFA) 230-21 MCG/ACT inhaler Inhale 2 puffs into the lungs 2 (two) times daily. 05/24/23   Luciano Cutter, MD  indapamide (LOZOL) 1.25 MG tablet Take 1.25 mg by mouth daily.    [provider]  metFORMIN (GLUCOPHAGE) 500 MG tablet Take 500 mg by mouth daily.     [provider]  Multiple Vitamin (MULTIVITAMIN WITH MINERALS) TABS tablet Take 1 tablet by mouth daily.    [provider]  MYRBETRIQ 50 MG TB24 tablet Take 50 mg by mouth daily.    [provider]  oxybutynin (DITROPAN-XL) 10 MG 24 hr tablet Take 10 mg by mouth at bedtime. Patient not taking: Reported on 03/17/2023    [provider]  pantoprazole (PROTONIX) 40 MG tablet Take 40 mg by mouth daily. Patient not taking: Reported on 03/17/2023    [provider]  sucralfate (CARAFATE) 1 g tablet Take 1 g by mouth daily. Patient not taking: Reported on 03/17/2023 04/13/22   [provider]  traMADol (ULTRAM) 50 MG tablet Take 1-2 tablets (50-100 mg  total) by mouth every 6 (six) hours as needed. Patient not taking: Reported on 05/24/2023 08/15/20   Darnell Level, MD   Social History   Socioeconomic History   Marital status: Single    Spouse name: Not on file   Number of children: Not on file   Years of education: Not on file   Highest education level: Not on file  Occupational History   Not on file  Tobacco Use   Smoking status: Never   Smokeless tobacco: Never  Vaping Use   Vaping Use: Never used  Substance and  Sexual Activity   Alcohol use: No   Drug use: No   Sexual activity: Not on file  Other Topics Concern   Not on file  Social History Narrative   Not on file   Social Determinants of Health   Financial Resource Strain: Not on file  Food Insecurity: Not on file  Transportation Needs: Not on file  Physical Activity: Not on file  Stress: Not on file  Social Connections: Not on file   Family History  Problem Relation Age of Onset   Asthma Other     ROS: Currently denies lightheadedness, dizziness, Fever, chills, CP, SOB.   No personal history of DVT, PE, MI, or CVA. No loose teeth or dentures All other systems have been reviewed and were otherwise currently negative with the exception of those mentioned in the HPI and as above.  Objective: Vitals: Ht: 5'1" Wt: 142 lbs Temp: 98.7 BP: 156/70 Pulse: 91 O2 95% on room air.   Physical Exam: General: Alert, NAD. Trendelenberg Gait  HEENT: EOMI, Good Neck Extension  Pulm: No increased work of breathing.  Clear B/L A/P w/o crackle or wheeze.  CV: RRR, No m/g/r appreciated  GI: soft, NT, ND. BS x 4 quadrants Neuro: CN II-XII grossly intact without focal deficit.  Sensation intact distally Skin: No lesions in the area of chief complaint MSK/Surgical Site: non- TTP. Hip ROM decreased d/t pain. + Stinchfield. + SLR. + FABER/FADIR. Decreased strength.  NVI.    Imaging Review Plain radiographs demonstrate severe degenerative joint disease of the left hip.   The bone quality appears to be fair for age and reported activity level.  Preoperative templating of the joint replacement has been completed, documented, and submitted to the Operating Room personnel in order to optimize intra-operative equipment management.  Assessment: OA LEFT HIP Active Problems:   * No active hospital problems. *   Plan: Plan for Procedure(s): TOTAL HIP ARTHROPLASTY ANTERIOR APPROACH  The patient history, physical exam, clinical judgement of the provider  and imaging are consistent with end stage degenerative joint disease and total joint arthroplasty is deemed medically necessary. The treatment options including medical management, injection therapy, and arthroplasty were discussed at length. The risks and benefits of Procedure(s): TOTAL HIP ARTHROPLASTY ANTERIOR APPROACH were presented and reviewed.  The risks of nonoperative treatment, versus surgical intervention including but not limited to continued pain, aseptic loosening, stiffness, dislocation/subluxation, infection, bleeding, nerve injury, blood clots, cardiopulmonary complications, morbidity, mortality, among others were discussed. The patient verbalizes understanding and wishes to proceed with the plan.  Patient is being admitted for surgery, pain control, PT, prophylactic antibiotics, VTE prophylaxis, progressive ambulation, ADL's and discharge planning.   Dental prophylaxis discussed and recommended for 2 years postoperatively.  The patient does meet the criteria for TXA which will be used perioperatively.   ASA 81 mg BID will be used postoperatively for DVT prophylaxis in addition to SCDs, and early  ambulation. Plan for Oxycodone, Mobic, Tylenol for pain.   Robaxin for muscle spasm.  Zofran for nausea and vomiting. Colace for constipation prevention Pharmacy- [facility] [address] [place] The patient is planning to be discharged home with OPPT and into the care of her boyfriend Bing Neighbors who can be reached at [phone] Follow up appt 07/28/23 at 4:15pm     [place] Board Office [phone] 06/21/2023 3:44 PM

## 2023-06-29 NOTE — Patient Instructions (Addendum)
SURGICAL WAITING ROOM VISITATION  Patients having surgery or a procedure may have no more than 2 support people in the waiting area - these visitors may rotate.    Children under the age of 66 must have an adult with them who is not the patient.  Due to an increase in RSV and influenza rates and associated hospitalizations, children ages 36 and under may not visit patients in [facility] hospitals.  If the patient needs to stay at the hospital during part of their recovery, the visitor guidelines for inpatient rooms apply. Pre-op nurse will coordinate an appropriate time for 1 support person to accompany patient in pre-op.  This support person may not rotate.    Please refer to the [facility] website for the visitor guidelines for Inpatients (after your surgery is over and you are in a regular room).       Your procedure is scheduled on: 07/13/23   Report to [facility] Main Entrance    Report to admitting at 5:15 AM   Call this number if you have problems the morning of surgery [phone]   Do not eat food :After Midnight.   After Midnight you may have the following liquids until _4:30 AM DAY OF SURGERY  Water Non-Citrus Juices (without pulp, NO RED-Apple, White grape, White cranberry) Black Coffee (NO MILK/CREAM OR CREAMERS, sugar ok)  Clear Tea (NO MILK/CREAM OR CREAMERS, sugar ok) regular and decaf                             Plain Jell-O (NO RED)                                           Fruit ices (not with fruit pulp, NO RED)                                     Popsicles (NO RED)                                                               Sports drinks like Gatorade (NO RED)                  The day of surgery:  Drink ONE (1) Pre-Surgery G2 at 4:30 AM the morning of surgery. Drink in one sitting. Do not sip.  This drink was given to you during your hospital  pre-op appointment visit. Nothing else to drink after completing the  Pre-SurgeryG2.      Oral  Hygiene is also important to reduce your risk of infection.                                    Remember - BRUSH YOUR TEETH THE MORNING OF SURGERY WITH YOUR REGULAR TOOTHPASTE   Take these medicines the morning of surgery: Tylenol if needed, amlodipine, Atorvastatin, Zyrtec,Metoprolol, Myrebetriq,Pantaprazole  DO NOT TAKE ANY ORAL DIABETIC MEDICATIONS DAY OF YOUR SURGERY. HOLD GLIPIZIDE AND METFORMIN THE DAY OF SURGERY.  You may not have any metal on your body including hair pins, jewelry, and body piercing             Do not wear make-up, lotions, powders, perfumes, or deodorant  Do not wear nail polish including gel and S&S, artificial/acrylic nails, or any other type of covering on natural nails including finger and toenails. If you have artificial nails, gel coating, etc. that needs to be removed by a nail salon please have this removed prior to surgery or surgery may need to be canceled/ delayed if the surgeon/ anesthesia feels like they are unable to be safely monitored.   Do not shave  48 hours prior to surgery.     Do not bring valuables to the hospital. [facility] IS NOT             RESPONSIBLE   FOR VALUABLES.   Contacts, glasses, dentures or bridgework may not be worn into surgery.  DO NOT BRING YOUR HOME MEDICATIONS TO THE HOSPITAL. PHARMACY WILL DISPENSE MEDICATIONS LISTED ON YOUR MEDICATION LIST TO YOU DURING YOUR ADMISSION IN THE HOSPITAL!    Patients discharged on the day of surgery will not be allowed to drive home.  Someone NEEDS to stay with you for the first 24 hours after anesthesia.   Special Instructions: Bring a copy of your healthcare power of attorney and living will documents the day of surgery if you haven't scanned them before.              Please read over the following fact sheets you were given: IF YOU HAVE QUESTIONS ABOUT YOUR PRE-OP INSTRUCTIONS PLEASE CALL [phone] [facility]   If you received a COVID test during your pre-op visit  it is  requested that you wear a mask when out in public, stay away from anyone that may not be feeling well and notify your surgeon if you develop symptoms. If you test positive for Covid or have been in contact with anyone that has tested positive in the last 10 days please notify you surgeon.      Pre-operative 5 CHG Bath Instructions   You can play a key role in reducing the risk of infection after surgery. Your skin needs to be as free of germs as possible. You can reduce the number of germs on your skin by washing with CHG (chlorhexidine gluconate) soap before surgery. CHG is an antiseptic soap that kills germs and continues to kill germs even after washing.   DO NOT use if you have an allergy to chlorhexidine/CHG or antibacterial soaps. If your skin becomes reddened or irritated, stop using the CHG and notify one of our RNs at [phone].   Please shower with the CHG soap starting 4 days before surgery using the following schedule:     Please keep in mind the following:  DO NOT shave, including legs and underarms, starting the day of your first shower.   You may shave your face at any point before/day of surgery.  Place clean sheets on your bed the day you start using CHG soap. Use a clean washcloth (not used since being washed) for each shower. DO NOT sleep with pets once you start using the CHG.   CHG Shower Instructions:  If you choose to wash your hair and private area, wash first with your normal shampoo/soap.  After you use shampoo/soap, rinse your hair and body thoroughly to remove shampoo/soap residue.  Turn the water OFF and apply about 3 tablespoons (45 ml)  of CHG soap to a CLEAN washcloth.  Apply CHG soap ONLY FROM YOUR NECK DOWN TO YOUR TOES (washing for 3-5 minutes)  DO NOT use CHG soap on face, private areas, open wounds, or sores.  Pay special attention to the area where your surgery is being performed.  If you are having back surgery, having someone wash your back for  you may be helpful. Wait 2 minutes after CHG soap is applied, then you may rinse off the CHG soap.  Pat dry with a clean towel  Put on clean clothes/pajamas   If you choose to wear lotion, please use ONLY the CHG-compatible lotions on the back of this paper.     Additional instructions for the day of surgery: DO NOT APPLY any lotions, deodorants, cologne, or perfumes.   Put on clean/comfortable clothes.  Brush your teeth.  Ask your nurse before applying any prescription medications to the skin.      CHG Compatible Lotions   Aveeno Moisturizing lotion  Cetaphil Moisturizing Cream  Cetaphil Moisturizing Lotion  Clairol Herbal Essence Moisturizing Lotion, Dry Skin  Clairol Herbal Essence Moisturizing Lotion, Extra Dry Skin  Clairol Herbal Essence Moisturizing Lotion, Normal Skin  Curel Age Defying Therapeutic Moisturizing Lotion with Alpha Hydroxy  Curel Extreme Care Body Lotion  Curel Soothing Hands Moisturizing Hand Lotion  Curel Therapeutic Moisturizing Cream, Fragrance-Free  Curel Therapeutic Moisturizing Lotion, Fragrance-Free  Curel Therapeutic Moisturizing Lotion, Original Formula  Eucerin Daily Replenishing Lotion  Eucerin Dry Skin Therapy Plus Alpha Hydroxy Crme  Eucerin Dry Skin Therapy Plus Alpha Hydroxy Lotion  Eucerin Original Crme  Eucerin Original Lotion  Eucerin Plus Crme Eucerin Plus Lotion  Eucerin TriLipid Replenishing Lotion  Keri Anti-Bacterial Hand Lotion  Keri Deep Conditioning Original Lotion Dry Skin Formula Softly Scented  Keri Deep Conditioning Original Lotion, Fragrance Free Sensitive Skin Formula  Keri Lotion Fast Absorbing Fragrance Free Sensitive Skin Formula  Keri Lotion Fast Absorbing Softly Scented Dry Skin Formula  Keri Original Lotion  Keri Skin Renewal Lotion Keri Silky Smooth Lotion  Keri Silky Smooth Sensitive Skin Lotion  Nivea Body Creamy Conditioning Oil  Nivea Body Extra Enriched Lotion  Nivea Body Original Lotion  Nivea Body  Sheer Moisturizing Lotion Nivea Crme  Nivea Skin Firming Lotion  NutraDerm 30 Skin Lotion  NutraDerm Skin Lotion  NutraDerm Therapeutic Skin Cream  NutraDerm Therapeutic Skin Lotion  ProShield Protective Hand Cream  Provon moisturizing lotion How to Manage Your Diabetes Before and After Surgery  Why is it important to control my blood sugar before and after surgery? Improving blood sugar levels before and after surgery helps healing and can limit problems. A way of improving blood sugar control is eating a healthy diet by:  Eating less sugar and carbohydrates  Increasing activity/exercise  Talking with your doctor about reaching your blood sugar goals High blood sugars (greater than 180 mg/dL) can raise your risk of infections and slow your recovery, so you will need to focus on controlling your diabetes during the weeks before surgery. Make sure that the doctor who takes care of your diabetes knows about your planned surgery including the date and location.  How do I manage my blood sugar before surgery? Check your blood sugar at least 4 times a day, starting 2 days before surgery, to make sure that the level is not too high or low. Check your blood sugar the morning of your surgery when you wake up and every 2 hours until you get to the Short Stay unit.  If your blood sugar is less than 70 mg/dL, you will need to treat for low blood sugar: Do not take insulin. Treat a low blood sugar (less than 70 mg/dL) with  cup of clear juice (cranberry or apple), 4 glucose tablets, OR glucose gel. Recheck blood sugar in 15 minutes after treatment (to make sure it is greater than 70 mg/dL). If your blood sugar is not greater than 70 mg/dL on recheck, call [phone] for further instructions. Report your blood sugar to the short stay nurse when you get to Short Stay.  If you are admitted to the hospital after surgery: Your blood sugar will be checked by the staff and you will probably be given  insulin after surgery (instead of oral diabetes medicines) to make sure you have good blood sugar levels. The goal for blood sugar control after surgery is 80-180 mg/dL.   WHAT DO I DO ABOUT MY DIABETES MEDICATION?  Do not take oral diabetes medicines (pills) the morning of surgery.  DO NOT TAKE THE FOLLOWING 7 DAYS PRIOR TO SURGERY: Ozempic, Wegovy, Rybelsus (Semaglutide), Byetta (exenatide), Bydureon (exenatide ER), Victoza, Saxenda (liraglutide), or Trulicity (dulaglutide) Mounjaro (Tirzepatide) Adlyxin (Lixisenatide), Polyethylene Glycol Loxenatide.  INCENTIVE SPIROMETER An incentive spirometer is a tool that can help keep your lungs clear and active. This tool measures how well you are filling your lungs with each breath. Taking long deep breaths may help reverse or decrease the chance of developing breathing (pulmonary) problems (especially infection) following: A long period of time when you are unable to move or be active. BEFORE THE PROCEDURE  If the spirometer includes an indicator to show your best effort, your nurse or respiratory therapist will set it to a desired goal. If possible, sit up straight or lean slightly forward. Try not to slouch. Hold the incentive spirometer in an upright position. INSTRUCTIONS FOR USE  Sit on the edge of your bed if possible, or sit up as far as you can in bed or on a chair. Hold the incentive spirometer in an upright position. Breathe out normally. Place the mouthpiece in your mouth and seal your lips tightly around it. Breathe in slowly and as deeply as possible, raising the piston or the ball toward the top of the column. Hold your breath for 3-5 seconds or for as long as possible. Allow the piston or ball to fall to the bottom of the column. Remove the mouthpiece from your mouth and breathe out normally. Rest for a few seconds and repeat Steps 1 through 7 at least 10 times every 1-2 hours when you are awake. Take your time and take a few  normal breaths between deep breaths. The spirometer may include an indicator to show your best effort. Use the indicator as a goal to work toward during each repetition. After each set of 10 deep breaths, practice coughing to be sure your lungs are clear. If you have an incision (the cut made at the time of surgery), support your incision when coughing by placing a pillow or rolled up towels firmly against it. Once you are able to get out of bed, walk around indoors and cough well. You may stop using the incentive spirometer when instructed by your caregiver.  RISKS AND COMPLICATIONS Take your time so you do not get dizzy or light-headed. If you are in pain, you may need to take or ask for pain medication before doing incentive spirometry. It is harder to take a deep breath if you are having pain. AFTER  USE Rest and breathe slowly and easily. It can be helpful to keep track of a log of your progress. Your caregiver can provide you with a simple table to help with this. If you are using the spirometer at home, follow these instructions: SEEK MEDICAL CARE IF:  You are having difficultly using the spirometer. You have trouble using the spirometer as often as instructed. Your pain medication is not giving enough relief while using the spirometer. You develop fever of 100.5 F (38.1 C) or higher. SEEK IMMEDIATE MEDICAL CARE IF:  You cough up bloody sputum that had not been present before. You develop fever of 102 F (38.9 C) or greater. You develop worsening pain at or near the incision site. MAKE SURE YOU:  Understand these instructions. Will watch your condition. Will get help right away if you are not doing well or get worse. Document Released: 04/12/2007 Document Revised: 02/22/2012 Document Reviewed: 06/13/2007 [facility] Patient Information 2014 [place], [place].

## 2023-06-29 NOTE — Progress Notes (Addendum)
COVID Vaccine received:  []  No [x]  Yes Date of any COVID positive Test in last 90 days: No PCP - Leah Meghan Manual Meier DO Cardiologist -  no Chest x-ray -  EKG -  07/01/23  EPIC Stress Test - no ECHO - no Cardiac Cath - no  Medical and cardiac clearance 06/02/23 Dr. Kerry Dory  Bowel Prep - [x]  No  []   Yes ______  Pacemaker / ICD device [x]  No []  Yes   Spinal Cord Stimulator:[x]  No []  Yes       History of Sleep Apnea? [x]  No []  Yes   CPAP used?- [x]  No []  Yes    Does the patient monitor blood sugar?          []  No [x]  Yes  []  N/A  Patient has: []  NO Hx DM   []  Pre-DM                 []  DM1  [x]   DM2 Does patient have a [employer] or Dexacom? [x]  No []  Yes   Fasting Blood Sugar Ranges- 120 Checks Blood Sugar ___1__ times a day  Pt. Takes Glipizide, Metformin  GLP1 agonist / usual dose - No GLP1 instructions:  SGLT-2 inhibitors / usual dose - No SGLT-2 instructions:   Blood Thinner / Instructions:No Aspirin Instructions:No  Comments:   Activity level: Patient is able to climb a flight of stairs without difficulty; [x]  No CP  [x]  No SOB, but would have ___   Patient can not perform ADLs without assistance.   Anesthesia review: HTN,DM,Asthma, CSF shunt  Patient denies shortness of breath, fever, cough and chest pain at PAT appointment.  Patient verbalized understanding and agreement to the Pre-Surgical Instructions that were given to them at this PAT appointment. Patient was also educated of the need to review these PAT instructions again prior to his/her surgery.I reviewed the appropriate phone numbers to call if they have any and questions or concerns.

## 2023-07-01 ENCOUNTER — Encounter (HOSPITAL_COMMUNITY): Payer: Self-pay

## 2023-07-01 ENCOUNTER — Other Ambulatory Visit: Payer: Self-pay

## 2023-07-01 ENCOUNTER — Encounter (HOSPITAL_COMMUNITY)
Admission: RE | Admit: 2023-07-01 | Discharge: 2023-07-01 | Disposition: A | Discharge status: Home / Self Care | Payer: Medicaid Other | Source: Ambulatory Visit | Attending: Orthopedic Surgery | Admitting: Orthopedic Surgery

## 2023-07-01 VITALS — Ht 61.0 in | Wt 140.0 lb

## 2023-07-01 DIAGNOSIS — Z01818 Encounter for other preprocedural examination: Secondary | ICD-10-CM | POA: Insufficient documentation

## 2023-07-01 DIAGNOSIS — E119 Type 2 diabetes mellitus without complications: Secondary | ICD-10-CM | POA: Insufficient documentation

## 2023-07-01 DIAGNOSIS — Z7984 Long term (current) use of oral hypoglycemic drugs: Secondary | ICD-10-CM | POA: Diagnosis not present

## 2023-07-01 DIAGNOSIS — I10 Essential (primary) hypertension: Secondary | ICD-10-CM | POA: Insufficient documentation

## 2023-07-01 DIAGNOSIS — J45909 Unspecified asthma, uncomplicated: Secondary | ICD-10-CM | POA: Diagnosis not present

## 2023-07-01 DIAGNOSIS — E785 Hyperlipidemia, unspecified: Secondary | ICD-10-CM | POA: Diagnosis not present

## 2023-07-01 HISTORY — DX: Other complications of anesthesia, initial encounter: T88.59XA

## 2023-07-01 HISTORY — DX: Unspecified asthma, uncomplicated: J45.909

## 2023-07-01 LAB — TYPE AND SCREEN
ABO/RH(D): O POS
Antibody Screen: NEGATIVE

## 2023-07-01 LAB — SURGICAL PCR SCREEN
MRSA, PCR: NEGATIVE
Staphylococcus aureus: POSITIVE — AB

## 2023-07-01 LAB — CBC
HCT: 34.9 % — ABNORMAL LOW (ref 36.0–46.0)
Hemoglobin: 11.1 g/dL — ABNORMAL LOW (ref 12.0–15.0)
MCH: 26.6 pg (ref 26.0–34.0)
MCHC: 31.8 g/dL (ref 30.0–36.0)
MCV: 83.7 fL (ref 80.0–100.0)
Platelets: 375 10*3/uL (ref 150–400)
RBC: 4.17 MIL/uL (ref 3.87–5.11)
RDW: 14 % (ref 11.5–15.5)
WBC: 6.7 10*3/uL (ref 4.0–10.5)
nRBC: 0 % (ref 0.0–0.2)

## 2023-07-01 LAB — BASIC METABOLIC PANEL
Anion gap: 9 (ref 5–15)
BUN: 18 mg/dL (ref 8–23)
CO2: 27 mmol/L (ref 22–32)
Calcium: 9.5 mg/dL (ref 8.9–10.3)
Chloride: 103 mmol/L (ref 98–111)
Creatinine, Ser: 0.77 mg/dL (ref 0.44–1.00)
GFR, Estimated: >60 mL/min (ref >60)
Glucose, Bld: 141 mg/dL — ABNORMAL HIGH (ref 70–99)
Potassium: 3.2 mmol/L — ABNORMAL LOW (ref 3.5–5.1)
Sodium: 139 mmol/L (ref 135–145)

## 2023-07-01 NOTE — Progress Notes (Signed)
Please review pt's preop PCR results from 07/01/23

## 2023-07-06 ENCOUNTER — Encounter (HOSPITAL_COMMUNITY): Payer: Self-pay

## 2023-07-06 NOTE — Anesthesia Preprocedure Evaluation (Addendum)
Anesthesia Evaluation  Patient identified by MRN, date of birth, ID band Patient awake    Reviewed: Allergy & Precautions, H&P , NPO status , Patient's Chart, lab work & pertinent test results  Airway Mallampati: II   Neck ROM: full    Dental   Pulmonary asthma    breath sounds clear to auscultation       Cardiovascular hypertension, Pt. on medications and Pt. on home beta blockers  Rhythm:regular Rate:Normal     Neuro/Psych negative neurological ROS     GI/Hepatic negative GI ROS, Neg liver ROS,,,  Endo/Other  diabetes, Type 2    Renal/GU negative Renal ROS     Musculoskeletal  (+) Arthritis ,    Abdominal   Peds  Hematology  (+) Blood dyscrasia, anemia   Anesthesia Other Findings   Reproductive/Obstetrics                             Anesthesia Physical Anesthesia Plan  ASA: 2  Anesthesia Plan: MAC and Spinal   Post-op Pain Management: Tylenol PO (pre-op)* and Toradol IV (intra-op)*   Induction: Intravenous  PONV Risk Score and Plan: 2 and Propofol infusion, Treatment may vary due to age or medical condition, Midazolam and Ondansetron  Airway Management Planned: Simple Face Mask and Natural Airway  Additional Equipment:   Intra-op Plan:   Post-operative Plan:   Informed Consent: I have reviewed the patients History and Physical, chart, labs and discussed the procedure including the risks, benefits and alternatives for the proposed anesthesia with the patient or authorized representative who has indicated his/her understanding and acceptance.     Dental advisory given  Plan Discussed with: CRNA, Anesthesiologist and Surgeon  Anesthesia Plan Comments: ( )        Anesthesia Quick Evaluation

## 2023-07-06 NOTE — Progress Notes (Signed)
Choose an anesthesia record to view details        DISCUSSION: Cheryl Huang is a 62 yo female who presents to PAT prior to left THA on 07/13/23 with Dr. Eulah Pont. PMH significant for asthma, HTN, DM2, HLD, hx CSF shunt in 1999.  Prior complication from anesthesia includes difficult airway. Note from right parathyroidectomy in 2021 states "Difficulty Due To:  reduced neck mobility, dentition, limited oral opening and anterior larynx Future Recommendations: Recommend- induction with short-acting agent, and alternative techniques readily available"  Patient is followed by PCP for chronic medical conditions. Last seen on 06/02/23 and cleared for surgery as medically optimized and moderate risk (paper copy in chart) "She does not have a history of CAD, PAD or peripheral vascular disease.I will initiate metoprolol for better blood pressure control,. Postoperative cardiac protection. - metoprolol succinate (TOPROL XL) 50 mg 24 hr tablet; Take 1 tablet (50 mg total) by mouth daily. Dispense: 90 tablet; Refill: 1 -I filled out preop clearance form sent by orthopedic surgery."  Patient follows with Pulmonology for mild asthma. Reported improvement in symptoms at last office visit on 05/24/23. Cleared for surgery with following recommendations: "ARISCAT score: Low risk. 1.6% risk of in-hospital post-op pulmonary complications  Overall, I recommend proceeding with the surgery if the risk for respiratory complications are outweighed by the potential benefits. This will need to be discussed between the patient and surgeon. To reduce risks of respiratory complications, I recommend: --Pre- and post-operative incentive spirometry performed frequently while awake --Avoiding use of pancuronium during anesthesia."  Of note patient is currently seeing neurology for evaluation of OSA but has not yet completed sleep study.   VS: Ht 5\' 1"  (1.549 m)   Wt 63.5 kg   LMP  (LMP Unknown)   BMI 26.45 kg/m    PROVIDERS: PCP: Karlene Einstein, MD Pulmonology: Chi Mechele Collin, MD  LABS: Labs reviewed: Acceptable for surgery. (all labs ordered are listed, but only abnormal results are displayed)  Labs Reviewed  SURGICAL PCR SCREEN - Abnormal; Notable for the following components:      Result Value   Staphylococcus aureus POSITIVE (*)    All other components within normal limits  BASIC METABOLIC PANEL - Abnormal; Notable for the following components:   Potassium 3.2 (*)    Glucose, Bld 141 (*)    All other components within normal limits  CBC - Abnormal; Notable for the following components:   Hemoglobin 11.1 (*)    HCT 34.9 (*)    All other components within normal limits  TYPE AND SCREEN     IMAGES:   EKG:  NSR, rate 76 Low voltage Non specific T wave abnormality No significant change from prior   CV:  Past Medical History:  Diagnosis Date   Anemia    Asthma    Complication of anesthesia    Diabetes mellitus without complication (HCC)    Hyperlipidemia    Hypertension     Past Surgical History:  Procedure Laterality Date   CSF SHUNT     states had shunt for her brain 17 yrs ago   PARATHYROIDECTOMY Right 08/15/2020   Procedure: RIGHT PARATHYROIDECTOMY;  Surgeon: Darnell Level, MD;  Location: [facility];  Service: General;  Laterality: Right;    MEDICATIONS:  acetaminophen (TYLENOL) 325 MG tablet   acetaminophen (TYLENOL) 650 MG CR tablet   amLODipine (NORVASC) 10 MG tablet   ascorbic acid (VITAMIN C) 500 MG tablet   atorvastatin (LIPITOR) 20 MG tablet   azelastine (ASTELIN) 0.1 %  nasal spray   budesonide-formoterol (SYMBICORT) 160-4.5 MCG/ACT inhaler   cetirizine (ZYRTEC) 10 MG tablet   Cholecalciferol (D3 ADULT PO)   ferrous sulfate 325 (65 FE) MG tablet   fluticasone (FLONASE) 50 MCG/ACT nasal spray   fluticasone-salmeterol (ADVAIR HFA) 230-21 MCG/ACT inhaler   glipiZIDE (GLUCOTROL XL) 5 MG 24 hr tablet   indapamide (LOZOL) 1.25 MG tablet   metFORMIN  (GLUCOPHAGE) 500 MG tablet   metoprolol succinate (TOPROL-XL) 50 MG 24 hr tablet   MYRBETRIQ 50 MG TB24 tablet   pantoprazole (PROTONIX) 40 MG tablet   No current facility-administered medications for this encounter.   Marcille Blanco [facility] Short Stay/Anesthesiology [facility] Phone [phone] 07/06/2023 12:30 PM

## 2023-07-13 ENCOUNTER — Encounter (HOSPITAL_COMMUNITY)
Admission: RE | Disposition: A | Discharge status: Home / Self Care | Payer: Self-pay | Source: Ambulatory Visit | Attending: Orthopedic Surgery

## 2023-07-13 ENCOUNTER — Ambulatory Visit (HOSPITAL_COMMUNITY): Payer: Medicaid Other | Admitting: Medical

## 2023-07-13 ENCOUNTER — Other Ambulatory Visit: Payer: Self-pay

## 2023-07-13 ENCOUNTER — Ambulatory Visit (HOSPITAL_COMMUNITY): Payer: Medicaid Other

## 2023-07-13 ENCOUNTER — Encounter (HOSPITAL_COMMUNITY): Payer: Self-pay | Admitting: Orthopedic Surgery

## 2023-07-13 ENCOUNTER — Ambulatory Visit (HOSPITAL_BASED_OUTPATIENT_CLINIC_OR_DEPARTMENT_OTHER): Payer: Medicaid Other | Admitting: Anesthesiology

## 2023-07-13 ENCOUNTER — Ambulatory Visit (HOSPITAL_COMMUNITY)
Admission: RE | Admit: 2023-07-13 | Discharge: 2023-07-13 | Disposition: A | Discharge status: Home / Self Care | Payer: Medicaid Other | Source: Ambulatory Visit | Attending: Orthopedic Surgery | Admitting: Orthopedic Surgery

## 2023-07-13 DIAGNOSIS — E119 Type 2 diabetes mellitus without complications: Secondary | ICD-10-CM | POA: Diagnosis not present

## 2023-07-13 DIAGNOSIS — I10 Essential (primary) hypertension: Secondary | ICD-10-CM | POA: Diagnosis not present

## 2023-07-13 DIAGNOSIS — M1612 Unilateral primary osteoarthritis, left hip: Secondary | ICD-10-CM

## 2023-07-13 DIAGNOSIS — Z79899 Other long term (current) drug therapy: Secondary | ICD-10-CM | POA: Diagnosis not present

## 2023-07-13 DIAGNOSIS — Z01818 Encounter for other preprocedural examination: Secondary | ICD-10-CM

## 2023-07-13 DIAGNOSIS — Z96642 Presence of left artificial hip joint: Secondary | ICD-10-CM | POA: Insufficient documentation

## 2023-07-13 HISTORY — PX: TOTAL HIP ARTHROPLASTY: SHX124

## 2023-07-13 LAB — GLUCOSE, CAPILLARY
Glucose-Capillary: 105 mg/dL — ABNORMAL HIGH (ref 70–99)
Glucose-Capillary: 123 mg/dL — ABNORMAL HIGH (ref 70–99)

## 2023-07-13 SURGERY — ARTHROPLASTY, HIP, TOTAL, ANTERIOR APPROACH
Anesthesia: Monitor Anesthesia Care | Site: Hip | Laterality: Left

## 2023-07-13 MED ORDER — LACTATED RINGERS IV BOLUS
250.0000 mL | Freq: Once | INTRAVENOUS | Status: AC
  Administered 2023-07-13: 250 mL via INTRAVENOUS

## 2023-07-13 MED ORDER — CEFAZOLIN SODIUM-DEXTROSE 2-4 GM/100ML-% IV SOLN
2.0000 g | INTRAVENOUS | Status: AC
  Administered 2023-07-13: 2 g via INTRAVENOUS
  Filled 2023-07-13: qty 100

## 2023-07-13 MED ORDER — METHOCARBAMOL 750 MG PO TABS: 750.0000 mg | ORAL_TABLET | Freq: Three times a day (TID) | ORAL | 0 refills | Status: DC | PRN

## 2023-07-13 MED ORDER — TRANEXAMIC ACID-NACL 1000-0.7 MG/100ML-% IV SOLN
INTRAVENOUS | Status: AC
  Administered 2023-07-13: 1000 mg via INTRAVENOUS
  Filled 2023-07-13: qty 100

## 2023-07-13 MED ORDER — DEXAMETHASONE SODIUM PHOSPHATE 10 MG/ML IJ SOLN
INTRAMUSCULAR | Status: DC | PRN
  Administered 2023-07-13: 10 mg via INTRAVENOUS

## 2023-07-13 MED ORDER — ASPIRIN 81 MG PO TBEC: 81.0000 mg | DELAYED_RELEASE_TABLET | Freq: Two times a day (BID) | ORAL | 0 refills | Status: AC

## 2023-07-13 MED ORDER — LACTATED RINGERS IV BOLUS
500.0000 mL | Freq: Once | INTRAVENOUS | Status: AC
  Administered 2023-07-13: 500 mL via INTRAVENOUS

## 2023-07-13 MED ORDER — OXYCODONE HCL 5 MG PO TABS: 5.0000 mg | ORAL_TABLET | Freq: Once | ORAL | Status: DC | PRN

## 2023-07-13 MED ORDER — LACTATED RINGERS IV SOLN: INTRAVENOUS | Status: DC

## 2023-07-13 MED ORDER — WATER FOR IRRIGATION, STERILE IR SOLN
Status: DC | PRN
  Administered 2023-07-13: 2000 mL

## 2023-07-13 MED ORDER — 0.9 % SODIUM CHLORIDE (POUR BTL) OPTIME
TOPICAL | Status: DC | PRN
  Administered 2023-07-13: 1000 mL

## 2023-07-13 MED ORDER — PROPOFOL 500 MG/50ML IV EMUL
INTRAVENOUS | Status: DC | PRN
  Administered 2023-07-13: 40 mg via INTRAVENOUS
  Administered 2023-07-13: 65 ug/kg/min via INTRAVENOUS

## 2023-07-13 MED ORDER — POVIDONE-IODINE 10 % EX SWAB: 2.0000 | Freq: Once | CUTANEOUS | Status: DC

## 2023-07-13 MED ORDER — BUPIVACAINE IN DEXTROSE 0.75-8.25 % IT SOLN
INTRATHECAL | Status: DC | PRN
  Administered 2023-07-13: 1.8 mL via INTRATHECAL

## 2023-07-13 MED ORDER — ONDANSETRON HCL 4 MG PO TABS: 4.0000 mg | ORAL_TABLET | Freq: Four times a day (QID) | ORAL | Status: DC | PRN

## 2023-07-13 MED ORDER — SODIUM CHLORIDE (PF) 0.9 % IJ SOLN
INTRAMUSCULAR | Status: AC
  Filled 2023-07-13: qty 10

## 2023-07-13 MED ORDER — INSULIN ASPART 100 UNIT/ML IJ SOLN: 0.0000 [IU] | INTRAMUSCULAR | Status: DC | PRN

## 2023-07-13 MED ORDER — OXYCODONE HCL 5 MG PO TABS
ORAL_TABLET | ORAL | Status: AC
  Filled 2023-07-13: qty 2

## 2023-07-13 MED ORDER — OXYCODONE HCL 5 MG/5ML PO SOLN: 5.0000 mg | Freq: Once | ORAL | Status: DC | PRN

## 2023-07-13 MED ORDER — MIDAZOLAM HCL 2 MG/2ML IJ SOLN
INTRAMUSCULAR | Status: DC | PRN
  Administered 2023-07-13: 2 mg via INTRAVENOUS

## 2023-07-13 MED ORDER — HYDROMORPHONE HCL 1 MG/ML IJ SOLN
0.5000 mg | INTRAMUSCULAR | Status: DC | PRN
  Administered 2023-07-13: 0.5 mg via INTRAVENOUS

## 2023-07-13 MED ORDER — FENTANYL CITRATE PF 50 MCG/ML IJ SOSY
PREFILLED_SYRINGE | INTRAMUSCULAR | Status: AC
  Filled 2023-07-13: qty 1

## 2023-07-13 MED ORDER — OXYCODONE HCL 5 MG PO TABS: 10.0000 mg | ORAL_TABLET | ORAL | Status: DC | PRN

## 2023-07-13 MED ORDER — TRANEXAMIC ACID-NACL 1000-0.7 MG/100ML-% IV SOLN: 1000.0000 mg | Freq: Once | INTRAVENOUS | Status: AC

## 2023-07-13 MED ORDER — FENTANYL CITRATE (PF) 100 MCG/2ML IJ SOLN
INTRAMUSCULAR | Status: DC | PRN
  Administered 2023-07-13 (×2): 50 ug via INTRAVENOUS

## 2023-07-13 MED ORDER — METOCLOPRAMIDE HCL 5 MG/ML IJ SOLN: 5.0000 mg | Freq: Three times a day (TID) | INTRAMUSCULAR | Status: DC | PRN

## 2023-07-13 MED ORDER — ONDANSETRON HCL 4 MG/2ML IJ SOLN
INTRAMUSCULAR | Status: DC | PRN
  Administered 2023-07-13: 4 mg via INTRAVENOUS

## 2023-07-13 MED ORDER — METHOCARBAMOL 500 MG IVPB - SIMPLE MED
INTRAVENOUS | Status: AC
  Filled 2023-07-13: qty 55

## 2023-07-13 MED ORDER — ACETAMINOPHEN 500 MG PO TABS
1000.0000 mg | ORAL_TABLET | Freq: Once | ORAL | Status: AC
  Administered 2023-07-13: 1000 mg via ORAL
  Filled 2023-07-13: qty 2

## 2023-07-13 MED ORDER — BUPIVACAINE LIPOSOME 1.3 % IJ SUSP
INTRAMUSCULAR | Status: DC | PRN
  Administered 2023-07-13: 10 mL

## 2023-07-13 MED ORDER — EPHEDRINE SULFATE-NACL 50-0.9 MG/10ML-% IV SOSY
PREFILLED_SYRINGE | INTRAVENOUS | Status: DC | PRN
  Administered 2023-07-13: 10 mg via INTRAVENOUS

## 2023-07-13 MED ORDER — ACETAMINOPHEN 325 MG PO TABS: 325.0000 mg | ORAL_TABLET | Freq: Four times a day (QID) | ORAL | Status: DC | PRN

## 2023-07-13 MED ORDER — ONDANSETRON HCL 4 MG/2ML IJ SOLN: 4.0000 mg | Freq: Four times a day (QID) | INTRAMUSCULAR | Status: DC | PRN

## 2023-07-13 MED ORDER — DEXAMETHASONE SODIUM PHOSPHATE 10 MG/ML IJ SOLN: 8.0000 mg | Freq: Once | INTRAMUSCULAR | Status: DC

## 2023-07-13 MED ORDER — LIDOCAINE 2% (20 MG/ML) 5 ML SYRINGE
INTRAMUSCULAR | Status: DC | PRN
  Administered 2023-07-13: 80 mg via INTRAVENOUS

## 2023-07-13 MED ORDER — BUPIVACAINE LIPOSOME 1.3 % IJ SUSP: 10.0000 mL | Freq: Once | INTRAMUSCULAR | Status: DC

## 2023-07-13 MED ORDER — HYDROMORPHONE HCL 1 MG/ML IJ SOLN
INTRAMUSCULAR | Status: AC
  Filled 2023-07-13: qty 1

## 2023-07-13 MED ORDER — PHENYLEPHRINE HCL-NACL 20-0.9 MG/250ML-% IV SOLN
INTRAVENOUS | Status: DC | PRN
Start: 2023-07-13 — End: 2023-07-13
  Administered 2023-07-13: 40 ug/min via INTRAVENOUS

## 2023-07-13 MED ORDER — ACETAMINOPHEN 500 MG PO TABS
ORAL_TABLET | ORAL | Status: AC
  Administered 2023-07-13: 1000 mg via ORAL
  Filled 2023-07-13: qty 2

## 2023-07-13 MED ORDER — ORAL CARE MOUTH RINSE: 15.0000 mL | Freq: Once | OROMUCOSAL | Status: AC

## 2023-07-13 MED ORDER — METHOCARBAMOL 500 MG PO TABS: 500.0000 mg | ORAL_TABLET | Freq: Four times a day (QID) | ORAL | Status: DC | PRN

## 2023-07-13 MED ORDER — OXYCODONE HCL 5 MG PO TABS: 5.0000 mg | ORAL_TABLET | ORAL | 0 refills | Status: DC | PRN

## 2023-07-13 MED ORDER — METHOCARBAMOL 500 MG IVPB - SIMPLE MED
500.0000 mg | Freq: Four times a day (QID) | INTRAVENOUS | Status: DC | PRN
  Administered 2023-07-13: 500 mg via INTRAVENOUS

## 2023-07-13 MED ORDER — ONDANSETRON 4 MG PO TBDP: 4.0000 mg | ORAL_TABLET | Freq: Three times a day (TID) | ORAL | 0 refills | Status: DC | PRN

## 2023-07-13 MED ORDER — OXYCODONE HCL 5 MG PO TABS
5.0000 mg | ORAL_TABLET | ORAL | Status: DC | PRN
  Administered 2023-07-13: 10 mg via ORAL

## 2023-07-13 MED ORDER — TRAMADOL HCL 50 MG PO TABS: 50.0000 mg | ORAL_TABLET | Freq: Four times a day (QID) | ORAL | Status: DC

## 2023-07-13 MED ORDER — TRANEXAMIC ACID-NACL 1000-0.7 MG/100ML-% IV SOLN
1000.0000 mg | INTRAVENOUS | Status: AC
  Administered 2023-07-13: 1000 mg via INTRAVENOUS
  Filled 2023-07-13: qty 100

## 2023-07-13 MED ORDER — CHLORHEXIDINE GLUCONATE 0.12 % MT SOLN
15.0000 mL | Freq: Once | OROMUCOSAL | Status: AC
  Administered 2023-07-13: 15 mL via OROMUCOSAL

## 2023-07-13 MED ORDER — CEFAZOLIN SODIUM-DEXTROSE 1-4 GM/50ML-% IV SOLN: 1.0000 g | Freq: Four times a day (QID) | INTRAVENOUS | Status: DC

## 2023-07-13 MED ORDER — ACETAMINOPHEN 500 MG PO TABS: 1000.0000 mg | ORAL_TABLET | Freq: Four times a day (QID) | ORAL | Status: DC

## 2023-07-13 MED ORDER — PHENYLEPHRINE 80 MCG/ML (10ML) SYRINGE FOR IV PUSH (FOR BLOOD PRESSURE SUPPORT)
PREFILLED_SYRINGE | INTRAVENOUS | Status: DC | PRN
  Administered 2023-07-13 (×2): 160 ug via INTRAVENOUS

## 2023-07-13 MED ORDER — BUPIVACAINE LIPOSOME 1.3 % IJ SUSP
INTRAMUSCULAR | Status: AC
  Filled 2023-07-13: qty 10

## 2023-07-13 MED ORDER — ACETAMINOPHEN 500 MG PO TABS: 1000.0000 mg | ORAL_TABLET | Freq: Four times a day (QID) | ORAL | 0 refills | Status: AC | PRN

## 2023-07-13 MED ORDER — MELOXICAM 15 MG PO TABS: 15.0000 mg | ORAL_TABLET | Freq: Every day | ORAL | 0 refills | Status: DC | PRN

## 2023-07-13 MED ORDER — METOCLOPRAMIDE HCL 5 MG PO TABS: 5.0000 mg | ORAL_TABLET | Freq: Three times a day (TID) | ORAL | Status: DC | PRN

## 2023-07-13 MED ORDER — SODIUM CHLORIDE FLUSH 0.9 % IV SOLN
INTRAVENOUS | Status: DC | PRN
  Administered 2023-07-13: 10 mL

## 2023-07-13 MED ORDER — DOCUSATE SODIUM 100 MG PO CAPS: 100.0000 mg | ORAL_CAPSULE | Freq: Two times a day (BID) | ORAL | 0 refills | Status: DC | PRN

## 2023-07-13 MED ORDER — FENTANYL CITRATE PF 50 MCG/ML IJ SOSY
25.0000 ug | PREFILLED_SYRINGE | INTRAMUSCULAR | Status: DC | PRN
  Administered 2023-07-13: 50 ug via INTRAVENOUS

## 2023-07-13 SURGICAL SUPPLY — 45 items
APL PRP STRL LF DISP 70% ISPRP (MISCELLANEOUS) ×1
BAG COUNTER SPONGE SURGICOUNT (BAG) IMPLANT
BAG SPEC THK2 15X12 ZIP CLS (MISCELLANEOUS)
BAG SPNG CNTER NS LX DISP (BAG)
BAG ZIPLOCK 12X15 (MISCELLANEOUS) IMPLANT
BLADE SAG 18X100X1.27 (BLADE) ×1 IMPLANT
CHLORAPREP W/TINT 26 (MISCELLANEOUS) ×1 IMPLANT
CLSR STERI-STRIP ANTIMIC 1/2X4 (GAUZE/BANDAGES/DRESSINGS) ×1 IMPLANT
COVER PERINEAL POST (MISCELLANEOUS) ×1 IMPLANT
COVER SURGICAL LIGHT HANDLE (MISCELLANEOUS) ×1 IMPLANT
DRAPE IMP U-DRAPE 54X76 (DRAPES) ×1 IMPLANT
DRAPE STERI IOBAN 125X83 (DRAPES) ×1 IMPLANT
DRAPE U-SHAPE 47X51 STRL (DRAPES) ×2 IMPLANT
DRSG MEPILEX POST OP 4X8 (GAUZE/BANDAGES/DRESSINGS) ×1 IMPLANT
ELECT REM PT RETURN 15FT ADLT (MISCELLANEOUS) ×1 IMPLANT
GLOVE BIO SURGEON STRL SZ7.5 (GLOVE) ×1 IMPLANT
GLOVE BIOGEL PI IND STRL 7.5 (GLOVE) ×1 IMPLANT
GLOVE BIOGEL PI IND STRL 8 (GLOVE) ×1 IMPLANT
GLOVE SURG SYN 7.5 E (GLOVE) ×1 IMPLANT
GLOVE SURG SYN 7.5 PF PI (GLOVE) ×1 IMPLANT
GOWN STRL REUS W/ TWL LRG LVL3 (GOWN DISPOSABLE) ×1 IMPLANT
GOWN STRL REUS W/ TWL XL LVL3 (GOWN DISPOSABLE) ×1 IMPLANT
GOWN STRL REUS W/TWL LRG LVL3 (GOWN DISPOSABLE) ×1
GOWN STRL REUS W/TWL XL LVL3 (GOWN DISPOSABLE) ×1
HEAD BIOLOX HIP 36/-5 (Joint) IMPLANT
HIP BIOLOX HD 36/-5 (Joint) ×1 IMPLANT
HOLDER FOLEY CATH W/STRAP (MISCELLANEOUS) IMPLANT
INSERT 0 DEGREE 36 (Miscellaneous) IMPLANT
KIT TURNOVER KIT A (KITS) IMPLANT
MANIFOLD NEPTUNE II (INSTRUMENTS) ×1 IMPLANT
NS IRRIG 1000ML POUR BTL (IV SOLUTION) ×1 IMPLANT
PACK ANTERIOR HIP CUSTOM (KITS) ×1 IMPLANT
PROTECTOR NERVE ULNAR (MISCELLANEOUS) ×1 IMPLANT
SCREW HEX LP 6.5X20 (Screw) IMPLANT
SHELL ACETAB TRIDENT 48 (Shell) IMPLANT
SPIKE FLUID TRANSFER (MISCELLANEOUS) ×2 IMPLANT
STEM STD OFFSET SZ3 32.5 (Stem) IMPLANT
SUT MNCRL AB 3-0 PS2 18 (SUTURE) ×1 IMPLANT
SUT VIC AB 0 CT1 36 (SUTURE) ×1 IMPLANT
SUT VIC AB 1 CT1 36 (SUTURE) ×1 IMPLANT
SUT VIC AB 2-0 CT1 27 (SUTURE) ×1
SUT VIC AB 2-0 CT1 TAPERPNT 27 (SUTURE) ×1 IMPLANT
TRAY FOLEY MTR SLVR 16FR STAT (SET/KITS/TRAYS/PACK) IMPLANT
TUBE SUCTION HIGH CAP CLEAR NV (SUCTIONS) ×1 IMPLANT
WATER STERILE IRR 1000ML POUR (IV SOLUTION) ×2 IMPLANT

## 2023-07-13 NOTE — Anesthesia Procedure Notes (Signed)
Spinal  Patient location during procedure: OR Start time: 07/13/2023 7:20 AM End time: 07/13/2023 7:25 AM Reason for block: surgical anesthesia Staffing Performed: anesthesiologist  Anesthesiologist: Marcene Duos, MD Performed by: Marcene Duos, MD Authorized by: Marcene Duos, MD   Preanesthetic Checklist Completed: patient identified, IV checked, site marked, risks and benefits discussed, surgical consent, monitors and equipment checked, pre-op evaluation and timeout performed Spinal Block Patient position: sitting Prep: DuraPrep Patient monitoring: heart rate, cardiac monitor, continuous pulse ox and blood pressure Approach: midline Location: L4-5 Injection technique: single-shot Needle Needle type: Pencan  Needle gauge: 24 G Needle length: 9 cm Assessment Sensory level: T4 Events: CSF return

## 2023-07-13 NOTE — Discharge Instructions (Addendum)

## 2023-07-13 NOTE — Op Note (Signed)
07/13/2023  8:58 AM  PATIENT:  Cheryl Huang   MRN: [id number]  PRE-OPERATIVE DIAGNOSIS:  OA LEFT HIP  POST-OPERATIVE DIAGNOSIS:  OA LEFT HIP  PROCEDURE:  Procedure(s): TOTAL HIP ARTHROPLASTY ANTERIOR APPROACH  PREOPERATIVE INDICATIONS:    [name] is an 62 y.o. female who has a diagnosis of <principal problem not specified> and elected for surgical management after failing conservative treatment.  The risks benefits and alternatives were discussed with the patient including but not limited to the risks of nonoperative treatment, versus surgical intervention including infection, bleeding, nerve injury, periprosthetic fracture, the need for revision surgery, dislocation, leg length discrepancy, blood clots, cardiopulmonary complications, morbidity, mortality, among others, and they were willing to proceed.     OPERATIVE REPORT     SURGEON:   [name], MD    ASSISTANT:  Levester Fresh, PA-C, she was present and scrubbed throughout the case, critical for completion in a timely fashion, and for retraction, instrumentation, and closure.     ANESTHESIA:  General    COMPLICATIONS:  None.     COMPONENTS:  Stryker insignia fit femur size 3 with a 36 mm -5 head ball and an acetabular shell size 48 with a  polyethylene liner    PROCEDURE IN DETAIL:   The patient was met in the holding area and  identified.  The appropriate hip was identified and marked at the operative site.  The patient was then transported to the OR  and  placed under anesthesia per that record.  At that point, the patient was  placed in the supine position and  secured to the operating room table and all bony prominences padded. He received pre-operative antibiotics    The operative lower extremity was prepped from the iliac crest to the distal leg.  Sterile draping was performed.  Time out was performed prior to incision.      Skin incision was made just 2 cm lateral to the ASIS  extending in line with  the tensor fascia lata. Electrocautery was used to control all bleeders. I dissected down sharply to the fascia of the tensor fascia lata was confirmed that the muscle fibers beneath were running posteriorly. I then incised the fascia over the superficial tensor fascia lata in line with the incision. The fascia was elevated off the anterior aspect of the muscle the muscle was retracted posteriorly and protected throughout the case. I then used electrocautery to incise the tensor fascia lata fascia control and all bleeders. Immediately visible was the fat over top of the anterior neck and capsule.  I removed the anterior fat from the capsule and elevated the rectus muscle off of the anterior capsule. I then removed a large time of capsule. The retractors were then placed over the anterior acetabulum as well as around the superior and inferior neck.  I then made a femoral neck cut. Then used the power corkscrew to remove the femoral head from the acetabulum and thoroughly irrigated the acetabulum. I sized the femoral head.    I then exposed the deep acetabulum, cleared out any tissue including the ligamentum teres.   After adequate visualization, I excised the labrum, and then sequentially reamed.  I then impacted the acetabular implant into place using fluoroscopy for guidance.  Appropriate version and inclination was confirmed clinically matching their bony anatomy, and with fluoroscopy.  I placed a 20 mm screw in the posterior/superio position with an excellent bite.    I then placed the polyethylene liner in place  I then adducted the leg and released the external rotators from the posterior femur allowing it to be easily delivered up lateral and anterior to the acetabulum for preparation of the femoral canal.    I then prepared the proximal femur using the cookie-cutter and then sequentially reamed and broached.  A trial broach, neck, and head was utilized, and I reduced the hip and used  floroscopy to assess the neck length and femoral implant.  I then impacted the femoral prosthesis into place into the appropriate version. The hip was then reduced and fluoroscopy confirmed appropriate position. Leg lengths were restored.  I then irrigated the hip copiously again with, and repaired the fascia with Vicryl, followed by monocryl for the subcutaneous tissue, Monocryl for the skin, Steri-Strips and sterile gauze. The patient was then awakened and returned to PACU in stable and satisfactory condition. There were no complications.  POST OPERATIVE PLAN: WBAT, DVT px: SCD's/TED, ambulation and chemical dvt px  Margarita Rana, MD Orthopedic Surgeon [phone]

## 2023-07-13 NOTE — Interval H&P Note (Signed)
History and Physical Interval Note:  07/13/2023 7:19 AM  Cheryl Huang  has presented today for surgery, with the diagnosis of OA LEFT HIP.  The various methods of treatment have been discussed with the patient and family. After consideration of risks, benefits and other options for treatment, the patient has consented to  Procedure(s): TOTAL HIP ARTHROPLASTY ANTERIOR APPROACH (Left) as a surgical intervention.  The patient's history has been reviewed, patient examined, no change in status, stable for surgery.  I have reviewed the patient's chart and labs.  Questions were answered to the patient's satisfaction.     [name]

## 2023-07-13 NOTE — Evaluation (Signed)
Physical Therapy Evaluation Patient Details Name: Cheryl Huang MRN: [id number] DOB: [birth year] Today's Date: 07/13/2023  History of Present Illness  62 y.o. female s/p L THA 07/13/23. PMH: HTN, anemia DM, CSF shunt, parathyroidectomy.  Clinical Impression  Pt is mobilizing well and is ready to DC home from a PT standpoint. She ambulated 120' with RW, no loss of balance. Stair training completed. Pt demonstrates good understanding of HEP.        If plan is discharge home, recommend the following: A little help with bathing/dressing/bathroom;Assistance with cooking/housework;Help with stairs or ramp for entrance   Can travel by private vehicle        Equipment Recommendations Rolling walker (2 wheels)  Recommendations for Other Services       Functional Status Assessment Patient has had a recent decline in their functional status and demonstrates the ability to make significant improvements in function in a reasonable and predictable amount of time.     Precautions / Restrictions Precautions Precautions: Fall Restrictions Weight Bearing Restrictions: No      Mobility  Bed Mobility Overal bed mobility: Modified Independent             General bed mobility comments: HOB up, mod I for supine to sit    Transfers Overall transfer level: Needs assistance Equipment used: Rolling walker (2 wheels) Transfers: Sit to/from Stand Sit to Stand: Supervision           General transfer comment: VCs hand placement    Ambulation/Gait Ambulation/Gait assistance: Supervision Gait Distance (Feet): 120 Feet Assistive device: Rolling walker (2 wheels) Gait Pattern/deviations: Step-to pattern, Decreased step length - right, Decreased step length - left Gait velocity: decr     General Gait Details: VCs sequencing initially  Stairs Stairs: Yes Stairs assistance: Min assist Stair Management: Backwards, Step to pattern, With walker Number of Stairs: 6 General stair  comments: 3 x 2 trials, significant other assisted with steadying RW, VCs sequencing, min A to steady RW  Wheelchair Mobility     Tilt Bed    Modified Rankin (Stroke Patients Only)       Balance Overall balance assessment: Modified Independent                                           Pertinent Vitals/Pain Pain Assessment Pain Assessment: 0-10 Pain Score: 5  Pain Location: L hip Pain Descriptors / Indicators: Operative site guarding, Sore Pain Intervention(s): Limited activity within patient's tolerance, Monitored during session, Premedicated before session, Ice applied    Home Living Family/patient expects to be discharged to:: Private residence Living Arrangements: Spouse/significant other Available Help at Discharge: Family   Home Access: Stairs to enter Entrance Stairs-Rails: None Secretary/administrator of Steps: 2   Home Layout: One level Home Equipment: Cane - single point      Prior Function Prior Level of Function : Independent/Modified Independent             Mobility Comments: no falls in past 6 months, walked without AD ADLs Comments: independent     Hand Dominance        Extremity/Trunk Assessment   Upper Extremity Assessment Upper Extremity Assessment: Overall WFL for tasks assessed    Lower Extremity Assessment Lower Extremity Assessment: LLE deficits/detail LLE Deficits / Details: hip ~+2/5, knee ext at least 3/5 LLE Sensation: WNL LLE Coordination: WNL    Cervical / Trunk Assessment  Cervical / Trunk Assessment: Normal  Communication   Communication: No difficulties  Cognition Arousal/Alertness: Awake/alert Behavior During Therapy: WFL for tasks assessed/performed Overall Cognitive Status: Within Functional Limits for tasks assessed                                          General Comments      Exercises Total Joint Exercises Ankle Circles/Pumps: AROM, Both, 10 reps, Supine Quad Sets:  AROM, Both, 5 reps, Supine Short Arc Quad: AROM, Left, 10 reps, Supine Heel Slides: AAROM, Left, 5 reps, Supine Hip ABduction/ADduction: AAROM, Left, 5 reps, Supine Long Arc Quad: AROM, Left, 10 reps, Seated   Assessment/Plan    PT Assessment Patient does not need any further PT services  PT Problem List         PT Treatment Interventions      PT Goals (Current goals can be found in the Care Plan section)  Acute Rehab PT Goals Patient Stated Goal: workout at gym PT Goal Formulation: All assessment and education complete, DC therapy    Frequency       Co-evaluation               AM-PAC PT "6 Clicks" Mobility  Outcome Measure Help needed turning from your back to your side while in a flat bed without using bedrails?: None Help needed moving from lying on your back to sitting on the side of a flat bed without using bedrails?: A Little Help needed moving to and from a bed to a chair (including a wheelchair)?: None Help needed standing up from a chair using your arms (e.g., wheelchair or bedside chair)?: None Help needed to walk in hospital room?: None Help needed climbing 3-5 steps with a railing? : A Little 6 Click Score: 22    End of Session Equipment Utilized During Treatment: Gait belt Activity Tolerance: Patient tolerated treatment well Patient left: in chair;with call bell/phone within reach;with family/visitor present Nurse Communication: Mobility status      Time: 1126-1202 PT Time Calculation (min) (ACUTE ONLY): 36 min   Charges:   PT Evaluation $PT Eval Moderate Complexity: 1 Mod PT Treatments $Gait Training: 8-22 mins PT General Charges $$ ACUTE PT VISIT: 1 Visit         Tamala Ser PT 07/13/2023  Acute Rehabilitation Services  Office [phone]

## 2023-07-13 NOTE — Transfer of Care (Signed)
Immediate Anesthesia Transfer of Care Note  Patient: Cheryl Huang  Procedure(s) Performed: Procedure(s): TOTAL HIP ARTHROPLASTY ANTERIOR APPROACH (Left)  Patient Location: PACU  Anesthesia Type:Spinal  Level of Consciousness: awake, alert  and oriented  Airway & Oxygen Therapy: Patient Spontanous Breathing  Post-op Assessment: Report given to RN and Post -op Vital signs reviewed and stable  Post vital signs: Reviewed and stable  Last Vitals:  Vitals:   07/13/23 0555  BP: (!) 132/107  Pulse: 82  Resp: 16  Temp: 36.5 C  SpO2: 99%    Complications: No apparent anesthesia complications

## 2023-07-14 ENCOUNTER — Encounter (HOSPITAL_COMMUNITY): Payer: Self-pay | Admitting: Orthopedic Surgery

## 2023-07-14 NOTE — Anesthesia Postprocedure Evaluation (Signed)
Anesthesia Post Note  Patient: Cheryl Huang  Procedure(s) Performed: TOTAL HIP ARTHROPLASTY ANTERIOR APPROACH (Left: Hip)     Patient location during evaluation: PACU Anesthesia Type: Spinal Level of consciousness: awake and alert Pain management: pain level controlled Vital Signs Assessment: post-procedure vital signs reviewed and stable Respiratory status: spontaneous breathing and respiratory function stable Cardiovascular status: blood pressure returned to baseline and stable Postop Assessment: spinal receding Anesthetic complications: no   No notable events documented.  Last Vitals:  Vitals:   07/13/23 1125 07/13/23 1200  BP: 124/65 113/65  Pulse: 70 73  Resp:  17  Temp:    SpO2: 97% 100%    Last Pain:  Vitals:   07/13/23 1200  TempSrc:   PainSc: 5                  Kennieth Rad

## 2023-09-04 ENCOUNTER — Emergency Department (HOSPITAL_BASED_OUTPATIENT_CLINIC_OR_DEPARTMENT_OTHER)
Admission: EM | Admit: 2023-09-04 | Discharge: 2023-09-04 | Disposition: A | Discharge status: Home / Self Care | Payer: Medicaid Other | Attending: Emergency Medicine | Admitting: Emergency Medicine

## 2023-09-04 ENCOUNTER — Encounter (HOSPITAL_BASED_OUTPATIENT_CLINIC_OR_DEPARTMENT_OTHER): Payer: Self-pay | Admitting: Emergency Medicine

## 2023-09-04 DIAGNOSIS — Z79899 Other long term (current) drug therapy: Secondary | ICD-10-CM | POA: Diagnosis not present

## 2023-09-04 DIAGNOSIS — X58XXXA Exposure to other specified factors, initial encounter: Secondary | ICD-10-CM | POA: Insufficient documentation

## 2023-09-04 DIAGNOSIS — Z7982 Long term (current) use of aspirin: Secondary | ICD-10-CM | POA: Insufficient documentation

## 2023-09-04 DIAGNOSIS — S39012A Strain of muscle, fascia and tendon of lower back, initial encounter: Secondary | ICD-10-CM | POA: Insufficient documentation

## 2023-09-04 DIAGNOSIS — Z7984 Long term (current) use of oral hypoglycemic drugs: Secondary | ICD-10-CM | POA: Diagnosis not present

## 2023-09-04 DIAGNOSIS — E119 Type 2 diabetes mellitus without complications: Secondary | ICD-10-CM | POA: Insufficient documentation

## 2023-09-04 DIAGNOSIS — I10 Essential (primary) hypertension: Secondary | ICD-10-CM | POA: Diagnosis not present

## 2023-09-04 DIAGNOSIS — M545 Low back pain, unspecified: Secondary | ICD-10-CM | POA: Diagnosis present

## 2023-09-04 MED ORDER — LIDOCAINE 5 % EX PTCH: 1.0000 | MEDICATED_PATCH | CUTANEOUS | 0 refills | Status: DC

## 2023-09-04 MED ORDER — LIDOCAINE 5 % EX PTCH
1.0000 | MEDICATED_PATCH | CUTANEOUS | Status: DC
  Administered 2023-09-04: 1 via TRANSDERMAL
  Filled 2023-09-04: qty 1

## 2023-09-04 MED ORDER — CYCLOBENZAPRINE HCL 10 MG PO TABS: 10.0000 mg | ORAL_TABLET | Freq: Two times a day (BID) | ORAL | 0 refills | Status: DC | PRN

## 2023-09-04 NOTE — ED Provider Notes (Signed)
[facility] Provider Note   CSN: [id number] Arrival date & time: 09/04/23  0865     History  Chief Complaint  Patient presents with   Back Pain    Cheryl Huang is a 62 y.o. female history of diabetes, hypertension, hyperlipidemia presented for low back pain that began after had a colonoscopy on Thursday last week.  Patient denies abdominal pain, saddle anesthesia, urinary/bowel incontinence, fevers, decreased sensation, new onset weakness, nausea/vomiting, IVDU, inability to stand or walk.  Patient dates that walking does make the pain worse and so it is moving.  Patient has tried Mobic at home which has helped slightly.  Patient denies any trauma.  Home Medications Prior to Admission medications   Medication Sig Start Date End Date Taking? Authorizing Provider  cyclobenzaprine (FLEXERIL) 10 MG tablet Take 1 tablet (10 mg total) by mouth 2 (two) times daily as needed for muscle spasms. 09/04/23  Yes Satoshi Kalas, Fayrene Fearing T, PA-C  lidocaine (LIDODERM) 5 % Place 1 patch onto the skin daily. Remove & Discard patch within 12 hours or as directed by MD 09/04/23  Yes Sidi Dzikowski, Beverly Gust, PA-C  acetaminophen (TYLENOL) 500 MG tablet Take 2 tablets (1,000 mg total) by mouth every 6 (six) hours as needed for mild pain or moderate pain. 07/13/23   Jenne Pane, PA-C  amLODipine (NORVASC) 10 MG tablet Take 10 mg by mouth daily.    [provider]  ascorbic acid (VITAMIN C) 500 MG tablet Take 500 mg by mouth daily.    [provider]  aspirin EC 81 MG tablet Take 1 tablet (81 mg total) by mouth 2 (two) times daily. To prevent blood clots for 30 days after surgery. 07/13/23   Jenne Pane, PA-C  atorvastatin (LIPITOR) 20 MG tablet Take 20 mg by mouth daily.    [provider]  azelastine (ASTELIN) 0.1 % nasal spray Place 2 sprays into both nostrils 2 (two) times daily. Use in each nostril as directed    [provider]   budesonide-formoterol (SYMBICORT) 160-4.5 MCG/ACT inhaler Inhale 2 puffs into the lungs 2 (two) times daily.    [provider]  cetirizine (ZYRTEC) 10 MG tablet Take 10 mg by mouth daily.    [provider]  Cholecalciferol (D3 ADULT PO) Take 1,000 Int'l Units by mouth daily.    [provider]  docusate sodium (COLACE) 100 MG capsule Take 1 capsule (100 mg total) by mouth 2 (two) times daily as needed for mild constipation or moderate constipation. 07/13/23 07/12/24  Jenne Pane, PA-C  ferrous sulfate 325 (65 FE) MG tablet Take 325 mg by mouth daily with breakfast.    [provider]  fluticasone (FLONASE) 50 MCG/ACT nasal spray Place 2 sprays into both nostrils in the morning and at bedtime. 06/22/22   [provider]  fluticasone-salmeterol (ADVAIR HFA) 230-21 MCG/ACT inhaler Inhale 2 puffs into the lungs 2 (two) times daily. 05/24/23   Luciano Cutter, MD  glipiZIDE (GLUCOTROL XL) 5 MG 24 hr tablet Take 5 mg by mouth daily with breakfast.    [provider]  indapamide (LOZOL) 1.25 MG tablet Take 1.25 mg by mouth daily.    [provider]  meloxicam (MOBIC) 15 MG tablet Take 1 tablet (15 mg total) by mouth daily as needed for pain (and inflammation). 07/13/23   Jenne Pane, PA-C  metFORMIN (GLUCOPHAGE) 500 MG tablet Take 500 mg by mouth daily.  [provider]  methocarbamol (ROBAXIN-750) 750 MG tablet Take 1 tablet (750 mg total) by mouth every 8 (eight) hours as needed for muscle spasms. 07/13/23   Jenne Pane, PA-C  metoprolol succinate (TOPROL-XL) 50 MG 24 hr tablet Take 50 mg by mouth daily. Take with or immediately following a meal.    [provider]  MYRBETRIQ 50 MG TB24 tablet Take 50 mg by mouth daily.    [provider]  ondansetron (ZOFRAN-ODT) 4 MG disintegrating tablet Take 1 tablet (4 mg total) by mouth every 8 (eight) hours as needed for nausea or vomiting. 07/13/23   Jenne Pane, PA-C  oxyCODONE (ROXICODONE) 5 MG immediate release tablet Take 1 tablet (5 mg total) by mouth every 4 (four) hours as needed for severe pain. 07/13/23   Jenne Pane, PA-C  pantoprazole (PROTONIX) 40 MG tablet Take 40 mg by mouth daily.    [provider]      Allergies    Penicillins    Review of Systems   Review of Systems  Musculoskeletal:  Positive for back pain.    Physical Exam Updated Vital Signs BP (!) 132/54 (BP Location: Right Arm)   Pulse 81   Temp 98.9 F (37.2 C) (Oral)   Resp 20   Ht 5\' 1"  (1.549 m)   Wt 64.9 kg   LMP  (LMP Unknown)   SpO2 100%   BMI 27.02 kg/m  Physical Exam Constitutional:      General: She is not in acute distress. Cardiovascular:     Rate and Rhythm: Normal rate.     Pulses: Normal pulses.  Abdominal:     General: There is no distension.     Palpations: Abdomen is soft.     Tenderness: There is no abdominal tenderness. There is no guarding or rebound.  Musculoskeletal:     Comments: Tenderness to palpation in left erector spinae musculature without abnormalities palpated 5 out of 5 bilateral hip flexion No midline tenderness or abnormalities palpated  Skin:    General: Skin is warm and dry.     Capillary Refill: Capillary refill takes less than 2 seconds.  Neurological:     Mental Status: She is alert.     Comments: Sensation intact distally 2+ bilateral patellar reflexes Able to ambulate without difficulty does endorse pain when ambulating  Psychiatric:        Mood and Affect: Mood normal.     ED Results / Procedures / Treatments   Labs (all labs ordered are listed, but only abnormal results are displayed) Labs Reviewed - No data to display  EKG None  Radiology No results found.  Procedures Procedures    Medications Ordered in ED Medications  lidocaine (LIDODERM) 5 % 1 patch (has no administration in time range)    ED Course/ Medical Decision Making/ A&P                                  Medical Decision Making Risk Prescription drug management.   Cheryl Huang 62 y.o. presented today for back pain. Working DDx that I considered at this time includes, but not limited to, MSK, underlying fracture, epidural hematoma/abscess, cauda equina syndrome, spinal stenosis, spinal malignancy, discitis, spinal infection, spondylitises/ spondylosis, conus medullaris, DDD of the back.  R/o DDx: underlying fracture, epidural hematoma/abscess, cauda equina syndrome, spinal stenosis, spinal malignancy, discitis, spinal infection, spondylitises/ spondylosis, conus medullaris, DDD of  the back, bowel perforation: less likely due to history of present illness, physical exam, labs/imaging findings.  Review of prior external notes: 08/13/2023 office visit  Unique Tests and My Interpretation: None  Discussion with Independent Historian: None  Discussion of Management of Tests: None  Risk: Medium: prescription drug management  Risk Stratification Score: None  Plan: On exam patient was left low back pain. No neurological deficits and normal neuro exam.  Patient can walk but states is painful.  No loss of bowel or bladder control.  No concern for cauda equina.  No fever, night sweats, weight loss, h/o cancer, IVDU.  RICE protocol and pain medicine indicated and discussed with patient.  Patient also given Flexeril and educated on not taking this medication before driving or operate machinery as it is a very sedating medicine.  Patient will also be prescribed Lidoderm patches and given 1 in the ED before discharge.  Patient did not have any abdominal tenderness on exam and when I offered to do abdominal labs as patient did have a recent colonoscopy to rule out any intestinal pathology that could be related to patient's back pain patient stated that she is not feel this is necessary and only wants the medicine to treat her back pain at this time which is reasonable.  Patient was given return  precautions. Patient stable for discharge at this time.  Patient verbalized understanding of plan.         Final Clinical Impression(s) / ED Diagnoses Final diagnoses:  Strain of lumbar region, initial encounter    Rx / DC Orders ED Discharge Orders          Ordered    lidocaine (LIDODERM) 5 %  Every 24 hours        09/04/23 0942    cyclobenzaprine (FLEXERIL) 10 MG tablet  2 times daily PRN        09/04/23 0942              Netta Corrigan, PA-C 09/04/23 0949    Rexford Maus, DO 09/04/23 1124

## 2023-09-04 NOTE — Discharge Instructions (Addendum)
We saw you in the ER for back pain. Fortunately, our evaluation is not concerning for emergent pathology such as spinal cord compression or infection.   Often the pain is self limiting, you just need time and supportive medications such as ibuprofen/tylenol every 6-8 hours for the next few days. Take the muscle relaxant as needed (however do not operate machinery or drive after using this medication due to its sedating side effects), use the lidocaine patches, and see your primary care doctor for further management if the symptoms continue to linger.   Please use the back exercises to strengthen the back muscles and be very careful with activities in future to prevent similar painful events.  Please return to the ER if your pain becomes excruciating or you start developing new numbness, weakness, urinary incontinence (peeing on self without warning), urinary retention (not able to pee despite bladder feeling full), inability to defecate, pins and needles sensation by your ano-genital area.

## 2023-09-04 NOTE — ED Notes (Signed)

## 2023-09-04 NOTE — ED Triage Notes (Signed)
Left lower back pain 2-3 weeks. Denies injury was seen by ortho and given steroid with no relief. Had left hip replaced on 7/30

## 2023-10-04 DIAGNOSIS — M47816 Spondylosis without myelopathy or radiculopathy, lumbar region: Secondary | ICD-10-CM | POA: Insufficient documentation

## 2023-10-04 DIAGNOSIS — M1611 Unilateral primary osteoarthritis, right hip: Secondary | ICD-10-CM | POA: Insufficient documentation

## 2023-10-26 DIAGNOSIS — H30033 Focal chorioretinal inflammation, peripheral, bilateral: Secondary | ICD-10-CM | POA: Insufficient documentation

## 2023-10-26 DIAGNOSIS — H2513 Age-related nuclear cataract, bilateral: Secondary | ICD-10-CM | POA: Insufficient documentation

## 2023-10-26 DIAGNOSIS — H3581 Retinal edema: Secondary | ICD-10-CM | POA: Insufficient documentation

## 2023-10-26 DIAGNOSIS — H35373 Puckering of macula, bilateral: Secondary | ICD-10-CM | POA: Insufficient documentation

## 2023-12-29 ENCOUNTER — Ambulatory Visit (HOSPITAL_BASED_OUTPATIENT_CLINIC_OR_DEPARTMENT_OTHER): Payer: Medicaid Other | Admitting: Pulmonary Disease

## 2023-12-29 ENCOUNTER — Encounter (HOSPITAL_BASED_OUTPATIENT_CLINIC_OR_DEPARTMENT_OTHER): Payer: Self-pay | Admitting: Pulmonary Disease

## 2023-12-29 ENCOUNTER — Other Ambulatory Visit (HOSPITAL_COMMUNITY): Payer: Self-pay

## 2023-12-29 VITALS — BP 148/78 | HR 86 | Resp 16 | Ht 61.0 in | Wt 147.7 lb

## 2023-12-29 DIAGNOSIS — J984 Other disorders of lung: Secondary | ICD-10-CM

## 2023-12-29 DIAGNOSIS — J4530 Mild persistent asthma, uncomplicated: Secondary | ICD-10-CM

## 2023-12-29 MED ORDER — FLUTICASONE-SALMETEROL 230-21 MCG/ACT IN AERO: 2.0000 | INHALATION_SPRAY | Freq: Two times a day (BID) | RESPIRATORY_TRACT | 2 refills | Status: AC

## 2023-12-29 MED ORDER — IPRATROPIUM BROMIDE 0.06 % NA SOLN: 2.0000 | Freq: Four times a day (QID) | NASAL | 1 refills | Status: DC

## 2023-12-29 NOTE — Progress Notes (Signed)
 Subjective:   PATIENT ID: Cheryl Huang GENDER: female DOB: 08/31/1961, MRN: [id number]   HPI  Chief Complaint  Patient presents with   Follow-up    Asthma     Reason for Visit: Follow-up asthma  [name] is a 63 year old female never smoker with HTN, DM2, HLD, hx CSF shunt in 1999 who presents for follow-up  Initial consult For many years she has bronchitis like symptoms with wheezing usually during colder weather. In th last five months she has had worsening sputum production that improves with clearing her throat however it recurs. Worsens when laying down. Has some wheezing. Denies coughing spells. Denies shortness of breath. When she completed PFTs she reports that it improved.  Denies childhood asthma. Denies nasal congestion.  06/24/22 Since starting her Symbicort  and also starting fluticasone  nasal spray she reports improved symptoms. No longer having productive cough except for morning sputum. No longer wheezing. Denies shortness of breath. Her activity is limited due to plans for left hip replacement. But able to ambulate within the house and perform ADLs. Has difficulty with long distances such as walking to the grocery store.  03/17/23 She is compliant with her Symbicort . No exacerbations since our last visit. Does feel short of breath at night. She has unchanged mild mucous productive that is unchanged. Some wheezing at night. Will feel choked up. This occurs nightly. During the day her symptoms are well controlled. Is planning for left hip surgery this summer.  05/24/23 She reports improved shortness of breath and wheezing but still having sensation of mucous in her throat. Will cough. She has been compliant with her Advair and flonase twice a day. Otherwise overall doing well. She believes she is on singulair but it is not on her med list.  12/29/23 Since our last visit she is using Advair as needed. Not needing rescue inhaler. No exacerbations since last  visit. She does have allergies and reports astelin is not working as well. Not taking any allergy medication or singulair  Asthma Control Test ACT Total Score  12/29/2023  1:03 PM 24  05/24/2023  8:45 AM 23  03/17/2023 10:30 AM 15   Social History: Never smoker Multiple family members with asthma Previously worked in [employer] x 5  Past Medical History:  Diagnosis Date   Anemia    Asthma    Complication of anesthesia    Diabetes mellitus without complication (HCC)    Hyperlipidemia    Hypertension      Family History  Problem Relation Age of Onset   Asthma Other      Social History   Occupational History   Not on file  Tobacco Use   Smoking status: Never   Smokeless tobacco: Never  Vaping Use   Vaping status: Never Used  Substance and Sexual Activity   Alcohol use: No   Drug use: No   Sexual activity: Not on file    Allergies  Allergen Reactions   Penicillins Rash     Outpatient Medications Prior to Visit  Medication Sig Dispense Refill   acetaminophen  (TYLENOL ) 500 MG tablet Take 2 tablets (1,000 mg total) by mouth every 6 (six) hours as needed for mild pain or moderate pain. 60 tablet 0   amLODipine  (NORVASC ) 10 MG tablet Take 10 mg by mouth daily.     ascorbic acid (VITAMIN C) 500 MG tablet Take 500 mg by mouth daily.     aspirin  EC 81 MG tablet Take 1 tablet (81 mg  total) by mouth 2 (two) times daily. To prevent blood clots for 30 days after surgery. 60 tablet 0   atorvastatin (LIPITOR) 20 MG tablet Take 20 mg by mouth daily.     Cholecalciferol (D3 ADULT PO) Take 1,000 Int'l Units by mouth daily.     cyclobenzaprine  (FLEXERIL ) 10 MG tablet Take 1 tablet (10 mg total) by mouth 2 (two) times daily as needed for muscle spasms. 20 tablet 0   docusate sodium  (COLACE) 100 MG capsule Take 1 capsule (100 mg total) by mouth 2 (two) times daily as needed for mild constipation or moderate constipation. 20 capsule 0   ferrous sulfate 325 (65 FE) MG tablet Take 325 mg by  mouth daily with breakfast.     fluticasone  (FLONASE) 50 MCG/ACT nasal spray Place 2 sprays into both nostrils in the morning and at bedtime.     fluticasone -salmeterol (ADVAIR HFA) 230-21 MCG/ACT inhaler Inhale 2 puffs into the lungs 2 (two) times daily. 1 each 5   glipiZIDE (GLUCOTROL XL) 5 MG 24 hr tablet Take 5 mg by mouth daily with breakfast.     indapamide (LOZOL) 1.25 MG tablet Take 1.25 mg by mouth daily.     lidocaine  (LIDODERM ) 5 % Place 1 patch onto the skin daily. Remove & Discard patch within 12 hours or as directed by MD 30 patch 0   meloxicam  (MOBIC ) 15 MG tablet Take 1 tablet (15 mg total) by mouth daily as needed for pain (and inflammation). 30 tablet 0   metFORMIN (GLUCOPHAGE) 500 MG tablet Take 500 mg by mouth daily.      methocarbamol  (ROBAXIN -750) 750 MG tablet Take 1 tablet (750 mg total) by mouth every 8 (eight) hours as needed for muscle spasms. 20 tablet 0   metoprolol succinate (TOPROL-XL) 50 MG 24 hr tablet Take 50 mg by mouth daily. Take with or immediately following a meal.     MYRBETRIQ 50 MG TB24 tablet Take 50 mg by mouth daily.     ondansetron  (ZOFRAN -ODT) 4 MG disintegrating tablet Take 1 tablet (4 mg total) by mouth every 8 (eight) hours as needed for nausea or vomiting. 15 tablet 0   oxyCODONE  (ROXICODONE ) 5 MG immediate release tablet Take 1 tablet (5 mg total) by mouth every 4 (four) hours as needed for severe pain. 30 tablet 0   pantoprazole (PROTONIX) 40 MG tablet Take 40 mg by mouth daily.     azelastine (ASTELIN) 0.1 % nasal spray Place 2 sprays into both nostrils 2 (two) times daily. Use in each nostril as directed     budesonide -formoterol  (SYMBICORT ) 160-4.5 MCG/ACT inhaler Inhale 2 puffs into the lungs 2 (two) times daily.     cetirizine  (ZYRTEC ) 10 MG tablet Take 10 mg by mouth daily.     No facility-administered medications prior to visit.    Review of Systems  Constitutional:  Negative for chills, diaphoresis, fever, malaise/fatigue and weight  loss.  HENT:  Positive for congestion.   Respiratory:  Negative for cough, hemoptysis, sputum production, shortness of breath and wheezing.   Cardiovascular:  Negative for chest pain, palpitations and leg swelling.     Objective:   Vitals:   12/29/23 1304  BP: (!) 148/78  Pulse: 86  Resp: 16  SpO2: 99%  Weight: 147 lb 11.2 oz (67 kg)  Height: 5\' 1"  (1.549 m)   SpO2: 99 % Physical Exam: General: Well-appearing, no acute distress HENT: [place], AT Eyes: EOMI, no scleral icterus Respiratory: Clear to auscultation bilaterally.  No  crackles, wheezing or rales Cardiovascular: RRR, -M/R/G, no JVD Extremities:-Edema,-tenderness Neuro: AAO x4, CNII-XII grossly intact Psych: Normal mood, normal affect  Data Reviewed:  Imaging: CXR 03/24/22 - no acute process  PFT: 03/27/22 FVC 1.85 (82%) FEV1 1.74 (99%) Ratio 74  TLC 76% DLCO 77% Interpretation: No obstructive defect on spirometry however significant bronchodilator response in FEV1 indicative of asthma. Co-comitant mild restrictive defect with mild DLCO.  03/17/23 FVC 1.59 (56%) FEV1 1.35 (62%) Ratio 85  TLC 66% DLCO 82% Interpretation: Moderate restrictive defect with normal DLCO. No significant BD response   Labs: CBC    Component Value Date/Time   WBC 6.7 07/01/2023 0920   RBC 4.17 07/01/2023 0920   HGB 11.1 (L) 07/01/2023 0920   HCT 34.9 (L) 07/01/2023 0920   PLT 375 07/01/2023 0920   MCV 83.7 07/01/2023 0920   MCH 26.6 07/01/2023 0920   MCHC 31.8 07/01/2023 0920   RDW 14.0 07/01/2023 0920    Assessment & Plan:   Discussion: 63 year old female never smoker with asthma, HTN, DM2, hx CSF shunt in 1999 who presents for follow-up. Discussed clinical course and management of asthma including bronchodilator regimen, preventive care including vaccinations and action plan for exacerbation.  Mild persistent asthma - well controlled on PRN ICS/LABA --CONTINUE Advair HFA 230-21 mcg TWO puffs in the morning and evening AS  NEEDED. REFILLED to dispense 3  >Please contact us  once you change insurance providers in Feb 2024. May need to switch inhalers --Hold on allergy medication. Discontinue singulair  Asthma Action Plan Use albuterol  as needed for worsening shortness of breath, wheezing and cough. If you symptoms do not improve in 24-48 hours, please our office for evaluation and/or prednisone  taper.  Co-comitant restrictive defect - likely effort related. Normal DLCO --Chest imaging and in-clinic O2 normal --Possible restrictive defect through suspect this may be related to effort +/- obstruction from asthma.  --Suspect effort related. Plan to recheck PFTs in 6-12 months after asthma under control at next visit --ORDER pulmonary function test  Nasal congestion --ORDER atrovent  nasal spray  Health Maintenance Immunization History  Administered Date(s) Administered   PFIZER Comirnaty(Gray Top)Covid-19 Tri-Sucrose Vaccine 06/22/2020, 07/13/2020   PNEUMOCOCCAL CONJUGATE-20 11/19/2022   Tdap 12/16/2022   Zoster Recombinant(Shingrix) 03/22/2021, 06/14/2021    Orders Placed This Encounter  Procedures   Pulmonary function test    Standing Status:   Future    Expiration Date:   12/28/2024    Where should this test be performed?:   [facility]    Full PFT: includes the following: basic spirometry, spirometry pre & post bronchodilator, diffusion capacity (DLCO), lung volumes:   Full PFT   Meds ordered this encounter  Medications   ipratropium (ATROVENT ) 0.06 % nasal spray    Sig: Place 2 sprays into both nostrils 4 (four) times daily.    Dispense:  15 mL    Refill:  1    Return in about 8 months (around 08/28/2024) for after PFT.  I have spent a total time of 31-minutes on the day of the appointment including chart review, data review, collecting history, coordinating care and discussing medical diagnosis and plan with the patient/family. Past medical history, allergies, medications were reviewed.  Pertinent imaging, labs and tests included in this note have been reviewed and interpreted independently by me.  Maricruz Lucero Genetta Kenning, MD [facility] 12/29/2023 1:17 PM

## 2023-12-29 NOTE — Patient Instructions (Addendum)
 Mild persistent asthma - well controlled on PRN ICS/LABA --CONTINUE Advair HFA 230-21 mcg TWO puffs in the morning and evening AS NEEDED. REFILLED to dispense 3 inhalers  >Please contact us  once you change insurance providers in Feb 2024. May need to switch inhalers --Hold on allergy medication. Discontinue singulair  Asthma Action Plan Use albuterol  as needed for worsening shortness of breath, wheezing and cough. If you symptoms do not improve in 24-48 hours, please our office for evaluation and/or prednisone  taper. Co-comitant restrictive defect - likely effort related. Normal DLCO --ORDER pulmonary function test  Nasal congestion --ORDER atrovent  nasal spray

## 2024-03-13 ENCOUNTER — Encounter (HOSPITAL_BASED_OUTPATIENT_CLINIC_OR_DEPARTMENT_OTHER): Payer: Self-pay | Admitting: Emergency Medicine

## 2024-03-13 ENCOUNTER — Other Ambulatory Visit: Payer: Self-pay

## 2024-03-13 ENCOUNTER — Emergency Department (HOSPITAL_BASED_OUTPATIENT_CLINIC_OR_DEPARTMENT_OTHER)
Admission: EM | Admit: 2024-03-13 | Discharge: 2024-03-13 | Disposition: A | Discharge status: Home / Self Care | Attending: Emergency Medicine | Admitting: Emergency Medicine

## 2024-03-13 ENCOUNTER — Emergency Department (HOSPITAL_BASED_OUTPATIENT_CLINIC_OR_DEPARTMENT_OTHER)

## 2024-03-13 DIAGNOSIS — Z7984 Long term (current) use of oral hypoglycemic drugs: Secondary | ICD-10-CM | POA: Insufficient documentation

## 2024-03-13 DIAGNOSIS — E119 Type 2 diabetes mellitus without complications: Secondary | ICD-10-CM | POA: Diagnosis not present

## 2024-03-13 DIAGNOSIS — Z7951 Long term (current) use of inhaled steroids: Secondary | ICD-10-CM | POA: Diagnosis not present

## 2024-03-13 DIAGNOSIS — Z79899 Other long term (current) drug therapy: Secondary | ICD-10-CM | POA: Diagnosis not present

## 2024-03-13 DIAGNOSIS — Z7982 Long term (current) use of aspirin: Secondary | ICD-10-CM | POA: Diagnosis not present

## 2024-03-13 DIAGNOSIS — I10 Essential (primary) hypertension: Secondary | ICD-10-CM | POA: Diagnosis present

## 2024-03-13 DIAGNOSIS — J45909 Unspecified asthma, uncomplicated: Secondary | ICD-10-CM | POA: Diagnosis not present

## 2024-03-13 LAB — BASIC METABOLIC PANEL WITH GFR
Anion gap: 13 (ref 5–15)
BUN: 13 mg/dL (ref 8–23)
CO2: 24 mmol/L (ref 22–32)
Calcium: 10 mg/dL (ref 8.9–10.3)
Chloride: 103 mmol/L (ref 98–111)
Creatinine, Ser: 0.84 mg/dL (ref 0.44–1.00)
GFR, Estimated: >60 mL/min (ref >60)
Glucose, Bld: 97 mg/dL (ref 70–99)
Potassium: 3.5 mmol/L (ref 3.5–5.1)
Sodium: 140 mmol/L (ref 135–145)

## 2024-03-13 LAB — CBC
HCT: 36.3 % (ref 36.0–46.0)
Hemoglobin: 11.7 g/dL — ABNORMAL LOW (ref 12.0–15.0)
MCH: 27.3 pg (ref 26.0–34.0)
MCHC: 32.2 g/dL (ref 30.0–36.0)
MCV: 84.8 fL (ref 80.0–100.0)
Platelets: 414 10*3/uL — ABNORMAL HIGH (ref 150–400)
RBC: 4.28 MIL/uL (ref 3.87–5.11)
RDW: 15.1 % (ref 11.5–15.5)
WBC: 6.4 10*3/uL (ref 4.0–10.5)
nRBC: 0 % (ref 0.0–0.2)

## 2024-03-13 LAB — TROPONIN I (HIGH SENSITIVITY)
Troponin I (High Sensitivity): 3 ng/L (ref <18)
Troponin I (High Sensitivity): 3 ng/L (ref <18)

## 2024-03-13 MED ORDER — ASPIRIN 81 MG PO CHEW
324.0000 mg | CHEWABLE_TABLET | Freq: Once | ORAL | Status: AC
  Administered 2024-03-13: 324 mg via ORAL
  Filled 2024-03-13: qty 4

## 2024-03-13 MED ORDER — HYDRALAZINE HCL 20 MG/ML IJ SOLN
10.0000 mg | Freq: Once | INTRAMUSCULAR | Status: AC
  Administered 2024-03-13: 10 mg via INTRAVENOUS
  Filled 2024-03-13: qty 1

## 2024-03-13 NOTE — ED Provider Notes (Signed)
 [facility] Provider Note   CSN: [id number] Arrival date & time: 03/13/24  1209     History  Chief Complaint  Patient presents with   Hypertension    Cheryl Huang is a 63 y.o. female.   Hypertension     The patient has a history of hypertension hyperlipidemia diabetes anemia asthma.  She presents ED for evaluation of persistent hypertension.  Patient states she has been taking losartan as well as metoprolol.  Her blood pressures have been over 200s including at the doctor's office today she went to see her doctor today for follow-up on her blood pressure.  Patient states she was sent to the emergency room.  Patient reports she has been having some soreness in her left arm recently.  She denies any trouble with headache.  No numbness or weakness.  No difficulty with her speech or balance.  No trouble with her grip.  I have reviewed the notes from her doctor's visit today on March 31.  There is no specific note indicating her referral to the ED and the underlying concern  Home Medications Prior to Admission medications   Medication Sig Start Date End Date Taking? Authorizing Provider  acetaminophen (TYLENOL) 500 MG tablet Take 2 tablets (1,000 mg total) by mouth every 6 (six) hours as needed for mild pain or moderate pain. 07/13/23   Jenne Pane, PA-C  amLODipine (NORVASC) 10 MG tablet Take 10 mg by mouth daily.    [provider]  ascorbic acid (VITAMIN C) 500 MG tablet Take 500 mg by mouth daily.    [provider]  aspirin EC 81 MG tablet Take 1 tablet (81 mg total) by mouth 2 (two) times daily. To prevent blood clots for 30 days after surgery. 07/13/23   Jenne Pane, PA-C  atorvastatin (LIPITOR) 20 MG tablet Take 20 mg by mouth daily.    [provider]  Cholecalciferol (D3 ADULT PO) Take 1,000 Int'l Units by mouth daily.    [provider]  cyclobenzaprine (FLEXERIL) 10 MG tablet Take 1  tablet (10 mg total) by mouth 2 (two) times daily as needed for muscle spasms. 09/04/23   Netta Corrigan, PA-C  docusate sodium (COLACE) 100 MG capsule Take 1 capsule (100 mg total) by mouth 2 (two) times daily as needed for mild constipation or moderate constipation. 07/13/23 07/12/24  Jenne Pane, PA-C  ferrous sulfate 325 (65 FE) MG tablet Take 325 mg by mouth daily with breakfast.    [provider]  fluticasone (FLONASE) 50 MCG/ACT nasal spray Place 2 sprays into both nostrils in the morning and at bedtime. 06/22/22   [provider]  fluticasone-salmeterol (ADVAIR HFA) 230-21 MCG/ACT inhaler Inhale 2 puffs into the lungs 2 (two) times daily. 12/29/23   Luciano Cutter, MD  glipiZIDE (GLUCOTROL XL) 5 MG 24 hr tablet Take 5 mg by mouth daily with breakfast.    [provider]  indapamide (LOZOL) 1.25 MG tablet Take 1.25 mg by mouth daily.    [provider]  ipratropium (ATROVENT) 0.06 % nasal spray Place 2 sprays into both nostrils 4 (four) times daily. 12/29/23   Luciano Cutter, MD  lidocaine (LIDODERM) 5 % Place 1 patch onto the skin daily. Remove & Discard patch within 12 hours or as directed by MD 09/04/23   Netta Corrigan, PA-C  meloxicam (MOBIC) 15 MG tablet Take 1 tablet (15 mg total) by mouth daily as needed  for pain (and inflammation). 07/13/23   Jenne Pane, PA-C  metFORMIN (GLUCOPHAGE) 500 MG tablet Take 500 mg by mouth daily.     [provider]  methocarbamol (ROBAXIN-750) 750 MG tablet Take 1 tablet (750 mg total) by mouth every 8 (eight) hours as needed for muscle spasms. 07/13/23   Jenne Pane, PA-C  metoprolol succinate (TOPROL-XL) 50 MG 24 hr tablet Take 50 mg by mouth daily. Take with or immediately following a meal.    [provider]  MYRBETRIQ 50 MG TB24 tablet Take 50 mg by mouth daily.    [provider]  ondansetron (ZOFRAN-ODT) 4 MG disintegrating tablet Take 1 tablet (4 mg total) by mouth every 8  (eight) hours as needed for nausea or vomiting. 07/13/23   Jenne Pane, PA-C  oxyCODONE (ROXICODONE) 5 MG immediate release tablet Take 1 tablet (5 mg total) by mouth every 4 (four) hours as needed for severe pain. 07/13/23   Jenne Pane, PA-C  pantoprazole (PROTONIX) 40 MG tablet Take 40 mg by mouth daily.    [provider]      Allergies    Penicillins    Review of Systems   Review of Systems  Physical Exam Updated Vital Signs BP (!) 217/82   Pulse 73   Temp 98.4 F (36.9 C) (Oral)   Resp 18   Ht 1.549 m (5\' 1" )   Wt 67 kg   LMP  (LMP Unknown)   SpO2 99%   BMI 27.91 kg/m  Physical Exam Vitals and nursing note reviewed.  Constitutional:      General: She is not in acute distress.    Appearance: She is well-developed.  HENT:     Head: Normocephalic and atraumatic.     Right Ear: External ear normal.     Left Ear: External ear normal.  Eyes:     General: No visual field deficit or scleral icterus.       Right eye: No discharge.        Left eye: No discharge.     Conjunctiva/sclera: Conjunctivae normal.  Neck:     Trachea: No tracheal deviation.  Cardiovascular:     Rate and Rhythm: Normal rate and regular rhythm.  Pulmonary:     Effort: Pulmonary effort is normal. No respiratory distress.     Breath sounds: Normal breath sounds. No stridor. No wheezing or rales.  Abdominal:     General: Bowel sounds are normal. There is no distension.     Palpations: Abdomen is soft.     Tenderness: There is no abdominal tenderness. There is no guarding or rebound.  Musculoskeletal:        General: No tenderness.     Cervical back: Neck supple.  Skin:    General: Skin is warm and dry.     Findings: No rash.  Neurological:     Mental Status: She is alert and oriented to person, place, and time.     Cranial Nerves: No cranial nerve deficit, dysarthria or facial asymmetry.     Sensory: No sensory deficit.     Motor: No abnormal muscle tone, seizure activity or  pronator drift.     Coordination: Coordination normal.     Comments:  able to hold both legs off bed for 5 seconds, sensation intact in all extremities,  no left or right sided neglect, normal finger-nose exam bilaterally, no nystagmus noted   Psychiatric:        Mood and  Affect: Mood normal.     ED Results / Procedures / Treatments   Labs (all labs ordered are listed, but only abnormal results are displayed) Labs Reviewed  BASIC METABOLIC PANEL WITH GFR  CBC  TROPONIN I (HIGH SENSITIVITY)    EKG EKG Interpretation Date/Time:  Monday March 13 2024 12:24:35 EDT Ventricular Rate:  66 PR Interval:  181 QRS Duration:  77 QT Interval:  421 QTC Calculation: 442 R Axis:   19  Text Interpretation: Sinus rhythm Borderline T abnormalities, anterior leads No significant change since last tracing Confirmed by Linwood Dibbles [phone]) on 03/13/2024 12:27:30 PM  Radiology No results found.  Procedures Procedures    Medications Ordered in ED Medications  aspirin chewable tablet 324 mg (has no administration in time range)  hydrALAZINE (APRESOLINE) injection 10 mg (has no administration in time range)    ED Course/ Medical Decision Making/ A&P Clinical Course as of 03/13/24 1706  Mon Mar 13, 2024  1519 63 yo female Sent by pcp for elevated bp Does not appear to have evidence of htn emergency Delta trop pending Bp has improved  [SG]  1614 Trop neg x2, no cp [SG]  1614 BP improved w/ hydralazine  [SG]    Clinical Course User Index [SG] Sloan Leiter, DO                                 Medical Decision Making Problems Addressed: Poorly-controlled hypertension: acute illness or injury that poses a threat to life or bodily functions  Amount and/or Complexity of Data Reviewed Labs: ordered. Decision-making details documented in ED Course. Radiology: ordered and independent interpretation performed.  Risk OTC drugs. Prescription drug management.   Pt presented for  evaluation of HTN.  Pt also with complaints of arm discomfort.  Exam reassuring.  No signs of vascular compromise.  Neuro exam normal.  Initial cardiac enzymes normal.  Plan on delta trop.  Pt treated with hydralazine.  Noted to have improvement in BP.  Care turned over to Dr Wallace Cullens pending second troponin.        Final Clinical Impression(s) / ED Diagnoses Final diagnoses:  Poorly-controlled hypertension    Rx / DC Orders ED Discharge Orders     None         Linwood Dibbles, MD 03/13/24 1710

## 2024-03-13 NOTE — ED Triage Notes (Signed)
 C/o worsening HTN since pcp changed medications. Denies any symptoms. Sent from pcp .

## 2024-03-13 NOTE — ED Notes (Signed)
 Pt. Reports her B/P has been up since a change in her B/P meds, Pt. Reports seeing her PCP today and being told to come to the ED today.  Pt. In no distress.  Pt. Also reports some L arm pain that has been off and on for a couple of days.  No other complaints.

## 2024-03-13 NOTE — ED Provider Notes (Signed)
  Provider Note MRN:  [id number]  Arrival date & time: 03/13/24    ED Course and Medical Decision Making  Assumed care from Dr Lynelle Doctor at shift change.  See note from prior team for complete details, in brief:  Clinical Course as of 03/13/24 2341  Mon Mar 13, 2024  1519 63 yo female Sent by pcp for elevated bp Does not appear to have evidence of htn emergency Delta trop pending Bp has improved  [SG]  1614 Trop neg x2, no cp [SG]  1614 BP improved w/ hydralazine  [SG]    Clinical Course User Index [SG] Tanda Rockers A, DO   Feeling better on recheck   The patient improved significantly and was discharged in stable condition. Detailed discussions were had with the patient/guardian regarding current findings, and need for close f/u with PCP or on call doctor. The patient/guardian has been instructed to return immediately if the symptoms worsen in any way for re-evaluation. Patient/guardian verbalized understanding and is in agreement with current care plan. All questions answered prior to discharge.    Procedures  Final Clinical Impressions(s) / ED Diagnoses     ICD-10-CM   1. Poorly-controlled hypertension  I10       ED Discharge Orders     None         Discharge Instructions      Your labs today are reassuring, no evidence of life-threatening condition at this time.  Your blood pressure has improved after medication given in the ER.  Please follow-up with your PCP for further evaluation of blood pressure medications and potential adjustment.  It was a pleasure caring for you today in the emergency department.  Please return to the emergency department for any worsening or worrisome symptoms.          Sloan Leiter, DO 03/13/24 2341

## 2024-03-13 NOTE — Discharge Instructions (Signed)
 Your labs today are reassuring, no evidence of life-threatening condition at this time.  Your blood pressure has improved after medication given in the ER.  Please follow-up with your PCP for further evaluation of blood pressure medications and potential adjustment.  It was a pleasure caring for you today in the emergency department.  Please return to the emergency department for any worsening or worrisome symptoms.

## 2024-04-11 ENCOUNTER — Ambulatory Visit (INDEPENDENT_AMBULATORY_CARE_PROVIDER_SITE_OTHER): Admitting: Internal Medicine

## 2024-04-11 ENCOUNTER — Encounter: Payer: Self-pay | Admitting: Internal Medicine

## 2024-04-11 VITALS — BP 180/90 | HR 72 | Temp 97.7°F | Ht 61.0 in | Wt 148.6 lb

## 2024-04-11 DIAGNOSIS — I152 Hypertension secondary to endocrine disorders: Secondary | ICD-10-CM | POA: Diagnosis not present

## 2024-04-11 DIAGNOSIS — E785 Hyperlipidemia, unspecified: Secondary | ICD-10-CM | POA: Diagnosis not present

## 2024-04-11 DIAGNOSIS — J3089 Other allergic rhinitis: Secondary | ICD-10-CM

## 2024-04-11 DIAGNOSIS — E119 Type 2 diabetes mellitus without complications: Secondary | ICD-10-CM

## 2024-04-11 DIAGNOSIS — E1169 Type 2 diabetes mellitus with other specified complication: Secondary | ICD-10-CM | POA: Diagnosis not present

## 2024-04-11 DIAGNOSIS — E1159 Type 2 diabetes mellitus with other circulatory complications: Secondary | ICD-10-CM | POA: Diagnosis not present

## 2024-04-11 DIAGNOSIS — J4530 Mild persistent asthma, uncomplicated: Secondary | ICD-10-CM

## 2024-04-11 DIAGNOSIS — Z7984 Long term (current) use of oral hypoglycemic drugs: Secondary | ICD-10-CM

## 2024-04-11 DIAGNOSIS — K219 Gastro-esophageal reflux disease without esophagitis: Secondary | ICD-10-CM | POA: Insufficient documentation

## 2024-04-11 DIAGNOSIS — D509 Iron deficiency anemia, unspecified: Secondary | ICD-10-CM | POA: Diagnosis not present

## 2024-04-11 DIAGNOSIS — E559 Vitamin D deficiency, unspecified: Secondary | ICD-10-CM

## 2024-04-11 LAB — HEMOGLOBIN A1C: Hgb A1c MFr Bld: 6.4 % (ref 4.6–6.5)

## 2024-04-11 LAB — MICROALBUMIN / CREATININE URINE RATIO
Creatinine,U: 109.7 mg/dL
Microalb Creat Ratio: 682.1 mg/g — ABNORMAL HIGH (ref 0.0–30.0)
Microalb, Ur: 74.8 mg/dL — ABNORMAL HIGH (ref 0.0–1.9)

## 2024-04-11 LAB — CBC WITH DIFFERENTIAL/PLATELET
Basophils Absolute: 0 10*3/uL (ref 0.0–0.1)
Basophils Relative: 0.6 % (ref 0.0–3.0)
Eosinophils Absolute: 0.4 10*3/uL (ref 0.0–0.7)
Eosinophils Relative: 6.8 % — ABNORMAL HIGH (ref 0.0–5.0)
HCT: 36.2 % (ref 36.0–46.0)
Hemoglobin: 11.7 g/dL — ABNORMAL LOW (ref 12.0–15.0)
Lymphocytes Relative: 21 % (ref 12.0–46.0)
Lymphs Abs: 1.1 10*3/uL (ref 0.7–4.0)
MCHC: 32.3 g/dL (ref 30.0–36.0)
MCV: 83.3 fl (ref 78.0–100.0)
Monocytes Absolute: 0.4 10*3/uL (ref 0.1–1.0)
Monocytes Relative: 8.3 % (ref 3.0–12.0)
Neutro Abs: 3.3 10*3/uL (ref 1.4–7.7)
Neutrophils Relative %: 63.3 % (ref 43.0–77.0)
Platelets: 398 10*3/uL (ref 150.0–400.0)
RBC: 4.34 Mil/uL (ref 3.87–5.11)
RDW: 14.8 % (ref 11.5–15.5)
WBC: 5.3 10*3/uL (ref 4.0–10.5)

## 2024-04-11 LAB — BASIC METABOLIC PANEL WITH GFR
BUN: 12 mg/dL (ref 6–23)
CO2: 30 meq/L (ref 19–32)
Calcium: 9.5 mg/dL (ref 8.4–10.5)
Chloride: 103 meq/L (ref 96–112)
Creatinine, Ser: 0.7 mg/dL (ref 0.40–1.20)
GFR: 90.91 mL/min (ref >60.00)
Glucose, Bld: 118 mg/dL — ABNORMAL HIGH (ref 70–99)
Potassium: 3.5 meq/L (ref 3.5–5.1)
Sodium: 141 meq/L (ref 135–145)

## 2024-04-11 LAB — LIPID PANEL
Cholesterol: 170 mg/dL (ref 0–200)
HDL: 54.1 mg/dL (ref >39.00)
LDL Cholesterol: 102 mg/dL — ABNORMAL HIGH (ref 0–99)
NonHDL: 115.96
Total CHOL/HDL Ratio: 3
Triglycerides: 70 mg/dL (ref 0.0–149.0)
VLDL: 14 mg/dL (ref 0.0–40.0)

## 2024-04-11 LAB — VITAMIN D 25 HYDROXY (VIT D DEFICIENCY, FRACTURES): VITD: 27.84 ng/mL — ABNORMAL LOW (ref 30.00–100.00)

## 2024-04-11 MED ORDER — LORATADINE 10 MG PO TABS: 10.0000 mg | ORAL_TABLET | Freq: Every day | ORAL | 11 refills | Status: DC

## 2024-04-11 MED ORDER — AMLODIPINE BESYLATE 5 MG PO TABS: 5.0000 mg | ORAL_TABLET | Freq: Every day | ORAL | 0 refills | Status: DC

## 2024-04-11 NOTE — Patient Instructions (Signed)
 BLOOD PRESSURE: Obtain an automatic blood pressure machine if you do not have one.   Check your blood pressure at home, write down blood pressure readings and bring to next appointment.  Goal is BP less than 140/90 consistently.  Adhere to a low salt diet ( no more than 1500mg  of salt/sodium per day) and exercise regularly   The nutrition facts label is a good place to find how much sodium is in foods. Look for products with no more than 400 mg of sodium per serving.  Remember that 1.5 g = 1500 mg.  The food label may also list foods as:  Sodium-free: Less than 5 mg in a serving.  Very low sodium: 35 mg or less in a serving.  Low-sodium: 140 mg or less in a serving.  Light in sodium: 50% less sodium in a serving. For example, if a food that usually has 300 mg of sodium is changed to become light in sodium, it will have 150 mg of sodium.  Reduced sodium: 25% less sodium in a serving. For example, if a food that usually has 400 mg of sodium is changed to reduced sodium, it will have 300 mg of sodium.

## 2024-04-11 NOTE — Progress Notes (Signed)
 [facility] [address] [place] [address] Dept: [phone] Dept Fax: [phone]  New Patient Office Visit  Subjective:   Cheryl Huang [birth year] 04/11/2024  Chief Complaint  Patient presents with   Establish Care   Hypertension    HPI: Cheryl Huang presents today to establish care at [facility] at [facility]. Introduced to Publishing rights manager role and practice setting.  All questions answered.  Concerns: See below   Discussed the use of AI scribe software for clinical note transcription with the patient, who gave verbal consent to proceed.  History of Present Illness   Cheryl Huang "Cheryl Huang" is a 63 year old female with hypertension who presents with uncontrolled high blood pressure.  She has been experiencing uncontrolled high blood pressure, with recent readings as high as 200 mmHg. Her blood pressure was previously well-managed with amlodipine and a diuretic, maintaining levels around 130-140 mmHg. After a change in her medication regimen by a new provider, her blood pressure has been consistently elevated, reaching 178 mmHg even after receiving hydralazine  in the ER on March 31st. She experiences headaches, blurred vision, dizziness, and feeling unwell, particularly during physical activities like walking in the mall or going to the gym.  Her current medications include losartan 100 mg once daily and metoprolol extended release 150 mg once daily. She was previously on amlodipine and a diuretic, which she felt managed her blood pressure more effectively. Diuretic caused mild hypokalemia , previous provider discontinued. She is concerned that her current medication regimen is not effective and is contributing to her elevated blood pressure.  She has a history of diabetes, managed with metformin and glipizide, with her last A1c recorded at 6.4% in January 2025. She also has hyperlipidemia, for which  she takes Crestor, and iron deficiency anemia, managed with an iron supplement. Additionally, she takes a vitamin D supplement for low vitamin D levels.  She has a history of asthma, using inhalers and experiencing mucus buildup in the mornings. She has been prescribed Zyrtec  for allergies, which causes drowsiness. She also sees a pulmonologist for her asthma management.  She has a history of overactive bladder, taking myrbetriq, reporting frequent nighttime urination despite limiting fluid intake after 5 PM. She is concerned about the impact of her medications on her kidney function, although she reports no previous issues with kidney function while on her prior medication regimen.  Her medical history also includes osteoarthritis, for which she underwent a hip replacement in July, cataracts and retinal issues managed by an eye doctor, and a past parathyroid  surgery to remove a large nodule.        The following portions of the patient's history were reviewed and updated as appropriate: past medical history, past surgical history, family history, social history, allergies, medications, and problem list.   Patient Active Problem List   Diagnosis Date Noted   Acid reflux 04/11/2024   Nuclear sclerotic cataract of both eyes 10/26/2023   Epiretinal membrane (ERM) of both eyes 10/26/2023   Peripheral focal chorioretinal inflammation of both eyes 10/26/2023   Retinal edema 10/26/2023   Osteoarthritis of lumbar spine 10/04/2023   Primary osteoarthritis of right hip 10/04/2023   S/P total left hip arthroplasty 07/13/2023   Iron deficiency anemia 05/17/2023   Non-seasonal allergic rhinitis 05/17/2023   Vitamin D deficiency 05/17/2023   Restrictive lung disease 03/17/2023   Hypertension associated with type 2 diabetes mellitus (HCC) 05/20/2022   OAB (overactive bladder) 05/20/2022   Type 2 diabetes  mellitus without complication, without long-term current use of insulin  (HCC) 05/20/2022    Hyperlipidemia associated with type 2 diabetes mellitus (HCC) 05/20/2022   Lactose intolerance, unspecified 05/20/2022   Mild persistent asthma without complication 04/16/2022   Hyperparathyroidism, primary (HCC) 08/14/2020   Pap smear of cervix shows high risk HPV present 06/30/2018   Past Medical History:  Diagnosis Date   Anemia    Asthma    Complication of anesthesia    Diabetes mellitus without complication (HCC)    Hyperlipidemia    Hypertension    Past Surgical History:  Procedure Laterality Date   CSF SHUNT     states had shunt for her brain 17 yrs ago   PARATHYROIDECTOMY Right 08/15/2020   Procedure: RIGHT PARATHYROIDECTOMY;  Surgeon: Oralee Billow, MD;  Location: [facility];  Service: General;  Laterality: Right;   TOTAL HIP ARTHROPLASTY Left 07/13/2023   Procedure: TOTAL HIP ARTHROPLASTY ANTERIOR APPROACH;  Surgeon: Saundra Curl, MD;  Location: [facility];  Service: Orthopedics;  Laterality: Left;   Family History  Problem Relation Age of Onset   Asthma Other     Current Outpatient Medications:    acetaminophen  (TYLENOL ) 500 MG tablet, Take 2 tablets (1,000 mg total) by mouth every 6 (six) hours as needed for mild pain or moderate pain., Disp: 60 tablet, Rfl: 0   amLODipine (NORVASC) 5 MG tablet, Take 1 tablet (5 mg total) by mouth daily., Disp: 90 tablet, Rfl: 0   ascorbic acid (VITAMIN C) 500 MG tablet, Take 500 mg by mouth daily., Disp: , Rfl:    aspirin  EC 81 MG tablet, Take 1 tablet (81 mg total) by mouth 2 (two) times daily. To prevent blood clots for 30 days after surgery., Disp: 60 tablet, Rfl: 0   atorvastatin (LIPITOR) 20 MG tablet, Take 20 mg by mouth daily., Disp: , Rfl:    Cholecalciferol (D3 ADULT PO), Take 1,000 Int'l Units by mouth daily., Disp: , Rfl:    ferrous sulfate 325 (65 FE) MG tablet, Take 325 mg by mouth daily with breakfast., Disp: , Rfl:    fluticasone  (FLONASE) 50 MCG/ACT nasal spray, Place 2 sprays into both nostrils in the morning and at  bedtime., Disp: , Rfl:    fluticasone -salmeterol (ADVAIR HFA) 230-21 MCG/ACT inhaler, Inhale 2 puffs into the lungs 2 (two) times daily., Disp: 36 g, Rfl: 2   glipiZIDE (GLUCOTROL XL) 5 MG 24 hr tablet, Take 5 mg by mouth daily with breakfast., Disp: , Rfl:    ipratropium (ATROVENT ) 0.06 % nasal spray, Place 2 sprays into both nostrils 4 (four) times daily., Disp: 15 mL, Rfl: 1   loratadine (CLARITIN) 10 MG tablet, Take 1 tablet (10 mg total) by mouth daily., Disp: 30 tablet, Rfl: 11   metFORMIN (GLUCOPHAGE) 500 MG tablet, Take 500 mg by mouth daily. , Disp: , Rfl:    metoprolol succinate (TOPROL-XL) 50 MG 24 hr tablet, Take 50 mg by mouth daily. Take with or immediately following a meal., Disp: , Rfl:    MYRBETRIQ 50 MG TB24 tablet, Take 50 mg by mouth daily., Disp: , Rfl:    pantoprazole (PROTONIX) 40 MG tablet, Take 40 mg by mouth daily., Disp: , Rfl:  Allergies  Allergen Reactions   Penicillins Rash    ROS: A complete ROS was performed with pertinent positives/negatives noted in the HPI. The remainder of the ROS are negative.   Objective:   Today's Vitals   04/11/24 0819 04/11/24 0927  BP: (!) 188/102 (!) 180/90  Pulse: 72   Temp: 97.7 F (36.5 C)   TempSrc: Temporal   SpO2: 97%   Weight: 148 lb 9.6 oz (67.4 kg)   Height: 5\' 1"  (1.549 m)     GENERAL: Well-appearing, in NAD. Well nourished.  SKIN: Pink, warm and dry. No rash, lesion, ulceration, or ecchymoses.  NECK: Trachea midline. Full ROM w/o pain or tenderness. No lymphadenopathy.  RESPIRATORY: Chest wall symmetrical. Respirations even and non-labored. Breath sounds clear to auscultation bilaterally.  CARDIAC: S1, S2 present, regular rate and rhythm. Peripheral pulses 2+ bilaterally.  EXTREMITIES: Without clubbing, cyanosis, or edema.  NEUROLOGIC: No motor or sensory deficits. Steady, even gait. Sensory exam of the foot is normal, tested with the monofilament. Good pulses, no lesions or ulcers, good peripheral  pulses. PSYCH/MENTAL STATUS: Alert, oriented x 3. Cooperative, appropriate mood and affect.   Health Maintenance Due  Topic Date Due   Medicare Annual Wellness (AWV)  Never done   Diabetic kidney evaluation - Urine ACR  Never done   MAMMOGRAM  06/30/2009   Cervical Cancer Screening (HPV/Pap Cotest)  04/14/2020   HEMOGLOBIN A1C  02/10/2021   DEXA SCAN  Never done    No results found for any visits on 04/11/24.  Assessment & Plan:  Assessment and Plan    Hypertension Hypertension poorly controlled with current regimen. Blood pressure at 188/102 mmHg. Previous regimen effective, current regimen inadequate. Discussed risks of hypertension including kidney damage, stroke, myocardial infarction. - Continue losartan 100 mg once daily. - Add amlodipine at 5 mg once daily.  - Reduce metoprolol ER to 50 mg once daily. - Monitor blood pressure at home and record readings. - Follow up in one week for blood pressure recheck.  Type 2 diabetes mellitus Type 2 diabetes mellitus with A1c of 6.4% indicating good control. No renal concerns with metformin. - Continue metformin and glipizide. - Order A1c, BMP, and microalbumin tests. - Perform foot exam.  Hyperlipidemia Hyperlipidemia managed with atorvastatin. - Continue atorvastatin. - Order lipid profile.  Iron deficiency anemia Iron deficiency anemia managed with daily iron supplementation. - Continue daily iron supplement. - Order CBC to check hemoglobin levels.  Vitamin D deficiency Vitamin D deficiency managed with vitamin D supplementation. - Continue vitamin D supplement 1000 units once daily. - Order vitamin D level.  Asthma Asthma managed with inhalers. Reports mucus production and cold-like symptoms. Current inhaler regimen effective. Discussed switching allergy medication to reduce drowsiness. - Continue inhalers. - Follow up with pulmonology as scheduled.  Nonallergic rhinitis Nonallergic rhinitis with mucus production  and cold-like symptoms. Previous cetirizine  caused drowsiness. - Prescribe loratadine once daily. - Discontinue cetirizine .  General Health Maintenance Routine health maintenance discussed. Due for mammogram and Pap smear. - Schedule mammogram at [facility] . - Schedule Pap smear with OBGYN.          Orders Placed This Encounter  Procedures   Hemoglobin A1C   CBC with Differential/Platelet   Basic Metabolic Panel (BMET)   Lipid panel   VITAMIN D 25 Hydroxy (Vit-D Deficiency, Fractures)   Microalbumin / creatinine urine ratio   Meds ordered this encounter  Medications   amLODipine (NORVASC) 5 MG tablet    Sig: Take 1 tablet (5 mg total) by mouth daily.    Dispense:  90 tablet    Refill:  0    Supervising Provider:   THOMPSON, AARON B [[id number]]   loratadine (CLARITIN) 10 MG tablet    Sig: Take 1 tablet (10 mg total) by mouth daily.  Dispense:  30 tablet    Refill:  11    Supervising Provider:   THOMPSON, AARON B [[id number]]    Return in about 1 week (around 04/18/2024) for Hypertension.   Gavin Kast, FNP

## 2024-04-14 ENCOUNTER — Encounter: Payer: Self-pay | Admitting: Internal Medicine

## 2024-04-14 ENCOUNTER — Other Ambulatory Visit: Payer: Self-pay | Admitting: Internal Medicine

## 2024-04-14 DIAGNOSIS — R809 Proteinuria, unspecified: Secondary | ICD-10-CM | POA: Insufficient documentation

## 2024-04-18 ENCOUNTER — Ambulatory Visit: Admitting: Internal Medicine

## 2024-04-18 ENCOUNTER — Encounter: Payer: Self-pay | Admitting: Internal Medicine

## 2024-04-18 VITALS — BP 160/90 | HR 69 | Temp 97.9°F | Ht 61.0 in | Wt 149.0 lb

## 2024-04-18 DIAGNOSIS — R809 Proteinuria, unspecified: Secondary | ICD-10-CM | POA: Diagnosis not present

## 2024-04-18 DIAGNOSIS — I152 Hypertension secondary to endocrine disorders: Secondary | ICD-10-CM | POA: Diagnosis not present

## 2024-04-18 DIAGNOSIS — E1159 Type 2 diabetes mellitus with other circulatory complications: Secondary | ICD-10-CM

## 2024-04-18 MED ORDER — AMLODIPINE BESYLATE 5 MG PO TABS: 10.0000 mg | ORAL_TABLET | Freq: Every day | ORAL | Status: DC

## 2024-04-18 MED ORDER — CHLORTHALIDONE 25 MG PO TABS: 25.0000 mg | ORAL_TABLET | Freq: Every day | ORAL | 0 refills | Status: DC

## 2024-04-18 NOTE — Progress Notes (Signed)
 [facility] [address] [place] [address] Dept: [phone] Dept Fax: [phone]    Subjective:   Cheryl Huang 12-26-1960 04/18/2024  Chief Complaint  Patient presents with   Follow-up    B/p follow up     HPI: Cheryl Huang presents today for re-assessment and management of chronic medical conditions.  Discussed the use of AI scribe software for clinical note transcription with the patient, who gave verbal consent to proceed.  History of Present Illness   Cheryl Huang "Cheryl Huang" is a 63 year old female with hypertension who presents for blood pressure management.  She brought a log of her blood pressure readings to the visit. She is currently taking losartan 100 mg once daily, amlodipine  5 mg once daily, and metoprolol XR 50 mg once daily. Previously, her blood pressure was as high as 188/102 and 200 SBP, which made her feel unwell. She feels better now compared to when her blood pressure was higher.  She was on Indapamide in the past for BP management, but was discontinued by prior PCP, possible mild hypokalemia? Patient was not fully sure but reports her BP was well controlled while taking thiazide diuretic.   She has a history of microalbuminuria, which was noted by previous doctors in the past per patient but not referred to a kidney specialist. Diabetes and high blood pressure were suggested as possible causes for this condition. Her blood sugar levels have been reported as good, with an A1c of 6.4%. Patient was referred to nephrology for further evaluation.    Home BP readings: 176/79, 167/79, 160/69, 160/73  BP Readings from Last 3 Encounters:  04/18/24 (!) 160/90  04/11/24 (!) 180/90  03/13/24 (!) 173/62    Lab Results  Component Value Date   MICROALBUR 74.8 (H) 04/11/2024     The following portions of the patient's history were reviewed and updated as appropriate: past medical history, past  surgical history, family history, social history, allergies, medications, and problem list.   Patient Active Problem List   Diagnosis Date Noted   Microalbuminuria 04/14/2024   Acid reflux 04/11/2024   Nuclear sclerotic cataract of both eyes 10/26/2023   Epiretinal membrane (ERM) of both eyes 10/26/2023   Peripheral focal chorioretinal inflammation of both eyes 10/26/2023   Retinal edema 10/26/2023   Osteoarthritis of lumbar spine 10/04/2023   Primary osteoarthritis of right hip 10/04/2023   S/P total left hip arthroplasty 07/13/2023   Iron deficiency anemia 05/17/2023   Non-seasonal allergic rhinitis 05/17/2023   Vitamin D  deficiency 05/17/2023   Restrictive lung disease 03/17/2023   Hypertension associated with type 2 diabetes mellitus (HCC) 05/20/2022   OAB (overactive bladder) 05/20/2022   Type 2 diabetes mellitus without complication, without long-term current use of insulin  (HCC) 05/20/2022   Hyperlipidemia associated with type 2 diabetes mellitus (HCC) 05/20/2022   Lactose intolerance, unspecified 05/20/2022   Mild persistent asthma without complication 04/16/2022   Hyperparathyroidism, primary (HCC) 08/14/2020   Pap smear of cervix shows high risk HPV present 06/30/2018   Past Medical History:  Diagnosis Date   Anemia    Asthma    Complication of anesthesia    Diabetes mellitus without complication (HCC)    Hyperlipidemia    Hypertension    Past Surgical History:  Procedure Laterality Date   CSF SHUNT     states had shunt for her brain 17 yrs ago   PARATHYROIDECTOMY Right 08/15/2020   Procedure: RIGHT PARATHYROIDECTOMY;  Surgeon: Oralee Billow, MD;  Location: [facility];  Service: General;  Laterality: Right;   TOTAL HIP ARTHROPLASTY Left 07/13/2023   Procedure: TOTAL HIP ARTHROPLASTY ANTERIOR APPROACH;  Surgeon: Saundra Curl, MD;  Location: [facility];  Service: Orthopedics;  Laterality: Left;   Family History  Problem Relation Age of Onset   Asthma Other      Current Outpatient Medications:    acetaminophen  (TYLENOL ) 500 MG tablet, Take 2 tablets (1,000 mg total) by mouth every 6 (six) hours as needed for mild pain or moderate pain., Disp: 60 tablet, Rfl: 0   ascorbic acid (VITAMIN C) 500 MG tablet, Take 500 mg by mouth daily., Disp: , Rfl:    aspirin  EC 81 MG tablet, Take 1 tablet (81 mg total) by mouth 2 (two) times daily. To prevent blood clots for 30 days after surgery., Disp: 60 tablet, Rfl: 0   atorvastatin (LIPITOR) 20 MG tablet, Take 20 mg by mouth daily., Disp: , Rfl:    chlorthalidone (HYGROTON) 25 MG tablet, Take 1 tablet (25 mg total) by mouth daily., Disp: 90 tablet, Rfl: 0   Cholecalciferol (D3 ADULT PO), Take 1,000 Int'l Units by mouth daily., Disp: , Rfl:    ferrous sulfate 325 (65 FE) MG tablet, Take 325 mg by mouth daily with breakfast., Disp: , Rfl:    fluticasone  (FLONASE) 50 MCG/ACT nasal spray, Place 2 sprays into both nostrils in the morning and at bedtime., Disp: , Rfl:    fluticasone -salmeterol (ADVAIR HFA) 230-21 MCG/ACT inhaler, Inhale 2 puffs into the lungs 2 (two) times daily., Disp: 36 g, Rfl: 2   glipiZIDE (GLUCOTROL XL) 5 MG 24 hr tablet, Take 5 mg by mouth daily with breakfast., Disp: , Rfl:    ipratropium (ATROVENT ) 0.06 % nasal spray, Place 2 sprays into both nostrils 4 (four) times daily., Disp: 15 mL, Rfl: 1   loratadine  (CLARITIN ) 10 MG tablet, Take 1 tablet (10 mg total) by mouth daily., Disp: 30 tablet, Rfl: 11   metFORMIN (GLUCOPHAGE) 500 MG tablet, Take 500 mg by mouth daily. , Disp: , Rfl:    metoprolol succinate (TOPROL-XL) 50 MG 24 hr tablet, Take 50 mg by mouth daily. Take with or immediately following a meal., Disp: , Rfl:    MYRBETRIQ 50 MG TB24 tablet, Take 50 mg by mouth daily., Disp: , Rfl:    pantoprazole (PROTONIX) 40 MG tablet, Take 40 mg by mouth daily., Disp: , Rfl:    amLODipine  (NORVASC ) 5 MG tablet, Take 2 tablets (10 mg total) by mouth daily., Disp: , Rfl:    losartan (COZAAR) 100 MG  tablet, Take 100 mg by mouth daily., Disp: , Rfl:  Allergies  Allergen Reactions   Penicillins Rash     ROS: A complete ROS was performed with pertinent positives/negatives noted in the HPI. The remainder of the ROS are negative.    Objective:   Today's Vitals   04/18/24 0809  BP: (!) 160/90  Pulse: 69  Temp: 97.9 F (36.6 C)  TempSrc: Temporal  SpO2: 98%  Weight: 149 lb (67.6 kg)  Height: 5\' 1"  (1.549 m)    GENERAL: Well-appearing, in NAD. Well nourished.  SKIN: Pink, warm and dry. No rash, lesion, ulceration, or ecchymoses.  NECK: Trachea midline. Full ROM w/o pain or tenderness. No lymphadenopathy.  RESPIRATORY: Chest wall symmetrical. Respirations even and non-labored. Breath sounds clear to auscultation bilaterally.  CARDIAC: S1, S2 present, regular rate and rhythm. Peripheral pulses 2+ bilaterally.  EXTREMITIES: Without clubbing, cyanosis, or edema.  NEUROLOGIC: No motor  or sensory deficits. Steady, even gait.  PSYCH/MENTAL STATUS: Alert, oriented x 3. Cooperative, appropriate mood and affect.   Health Maintenance Due  Topic Date Due   Medicare Annual Wellness (AWV)  Never done   DEXA SCAN  Never done    No results found for any visits on 04/18/24.  The 10-year ASCVD risk score (Arnett DK, et al., 2019) is: 28.9%     Assessment & Plan:  1. Hypertension associated with type 2 diabetes mellitus (HCC) (Primary) - chlorthalidone (HYGROTON) 25 MG tablet; Take 1 tablet (25 mg total) by mouth daily.  Dispense: 90 tablet; Refill: 0 - amLODipine  (NORVASC ) 5 MG tablet; Take 2 tablets (10 mg total) by mouth daily. - Continue Losartan 100mg  PO daily  - Continue Metoprolol XR 50mg  PO daily  - keep home BP log and bring to next visit - check BMP at next office visit in 1 week   2. Microalbuminuria - continue follow up with nephrology    No orders of the defined types were placed in this encounter.  No images are attached to the encounter or orders placed in the  encounter. Meds ordered this encounter  Medications   chlorthalidone (HYGROTON) 25 MG tablet    Sig: Take 1 tablet (25 mg total) by mouth daily.    Dispense:  90 tablet    Refill:  0    Supervising Provider:   THOMPSON, AARON B [[id number]]   amLODipine  (NORVASC ) 5 MG tablet    Sig: Take 2 tablets (10 mg total) by mouth daily.    Supervising Provider:   Catheryn Cluck [[id number]]    Return in about 1 week (around 04/25/2024) for Hypertension.   Gavin Kast, FNP

## 2024-04-25 ENCOUNTER — Other Ambulatory Visit: Payer: Self-pay

## 2024-04-25 ENCOUNTER — Ambulatory Visit: Payer: Self-pay | Admitting: Internal Medicine

## 2024-04-25 ENCOUNTER — Encounter: Payer: Self-pay | Admitting: Internal Medicine

## 2024-04-25 ENCOUNTER — Ambulatory Visit (INDEPENDENT_AMBULATORY_CARE_PROVIDER_SITE_OTHER): Admitting: Internal Medicine

## 2024-04-25 VITALS — BP 136/86 | HR 91 | Temp 97.6°F | Ht 61.0 in | Wt 146.0 lb

## 2024-04-25 DIAGNOSIS — I10 Essential (primary) hypertension: Secondary | ICD-10-CM | POA: Diagnosis not present

## 2024-04-25 DIAGNOSIS — K219 Gastro-esophageal reflux disease without esophagitis: Secondary | ICD-10-CM

## 2024-04-25 LAB — BASIC METABOLIC PANEL WITH GFR
BUN: 33 mg/dL — ABNORMAL HIGH (ref 6–23)
CO2: 29 meq/L (ref 19–32)
Calcium: 10.1 mg/dL (ref 8.4–10.5)
Chloride: 101 meq/L (ref 96–112)
Creatinine, Ser: 0.99 mg/dL (ref 0.40–1.20)
GFR: 59.96 mL/min — ABNORMAL LOW (ref >60.00)
Glucose, Bld: 159 mg/dL — ABNORMAL HIGH (ref 70–99)
Potassium: 3.6 meq/L (ref 3.5–5.1)
Sodium: 140 meq/L (ref 135–145)

## 2024-04-25 MED ORDER — CHLORTHALIDONE 25 MG PO TABS: 12.5000 mg | ORAL_TABLET | Freq: Every day | ORAL | Status: DC

## 2024-04-25 MED ORDER — PANTOPRAZOLE SODIUM 40 MG PO TBEC: 40.0000 mg | DELAYED_RELEASE_TABLET | Freq: Every day | ORAL | 3 refills | Status: AC

## 2024-04-25 MED ORDER — CHLORTHALIDONE 15 MG PO TABS: 15.0000 mg | ORAL_TABLET | Freq: Every day | ORAL | 0 refills | Status: DC

## 2024-04-25 NOTE — Progress Notes (Signed)
 [facility] [address] [place] [address] Dept: [phone] Dept Fax: [phone]    Subjective:   Cheryl Huang 08-23-1961 04/25/2024  Chief Complaint  Patient presents with   Follow-up    HPI: Cheryl Huang presents today for re-assessment and management of chronic medical conditions.  Discussed the use of AI scribe software for clinical note transcription with the patient, who gave verbal consent to proceed.  History of Present Illness   Cheryl Huang "Cheryl Huang" is a 63 year old female with hypertension who presents for a follow-up on blood pressure management.  She has been monitoring her blood pressure at home, noting previously high readings, with a peak of 200 mmHg after leaving the emergency room. Her current medication regimen includes amlodipine  10 mg, chlorthalidone  25 mg, losartan 100 mg, and metoprolol 50 mg daily. She experiences nocturia, urinating approximately three times per night, which she attributes to chlorthalidone , despite taking it in the morning and limiting fluid intake after 4:30 PM.   She requests a prescription for pantoprazole for her acid reflux, which she takes once daily in the morning to prevent gastrointestinal discomfort, particularly when consuming foods like eggs.       Home BP log: 151/68, 136/70, 137/64, 146/67, 133/64, 126/62   The following portions of the patient's history were reviewed and updated as appropriate: past medical history, past surgical history, family history, social history, allergies, medications, and problem list.   Patient Active Problem List   Diagnosis Date Noted   Microalbuminuria 04/14/2024   Acid reflux 04/11/2024   Nuclear sclerotic cataract of both eyes 10/26/2023   Epiretinal membrane (ERM) of both eyes 10/26/2023   Peripheral focal chorioretinal inflammation of both eyes 10/26/2023   Retinal edema 10/26/2023   Osteoarthritis of lumbar  spine 10/04/2023   Primary osteoarthritis of right hip 10/04/2023   S/P total left hip arthroplasty 07/13/2023   Iron deficiency anemia 05/17/2023   Non-seasonal allergic rhinitis 05/17/2023   Vitamin D  deficiency 05/17/2023   Restrictive lung disease 03/17/2023   Hypertension associated with type 2 diabetes mellitus (HCC) 05/20/2022   OAB (overactive bladder) 05/20/2022   Type 2 diabetes mellitus without complication, without long-term current use of insulin  (HCC) 05/20/2022   Hyperlipidemia associated with type 2 diabetes mellitus (HCC) 05/20/2022   Lactose intolerance, unspecified 05/20/2022   Mild persistent asthma without complication 04/16/2022   Hyperparathyroidism, primary (HCC) 08/14/2020   Pap smear of cervix shows high risk HPV present 06/30/2018   Past Medical History:  Diagnosis Date   Anemia    Asthma    Complication of anesthesia    Diabetes mellitus without complication (HCC)    Hyperlipidemia    Hypertension    Past Surgical History:  Procedure Laterality Date   CSF SHUNT     states had shunt for her brain 17 yrs ago   PARATHYROIDECTOMY Right 08/15/2020   Procedure: RIGHT PARATHYROIDECTOMY;  Surgeon: Oralee Billow, MD;  Location: [facility];  Service: General;  Laterality: Right;   TOTAL HIP ARTHROPLASTY Left 07/13/2023   Procedure: TOTAL HIP ARTHROPLASTY ANTERIOR APPROACH;  Surgeon: Saundra Curl, MD;  Location: [facility];  Service: Orthopedics;  Laterality: Left;   Family History  Problem Relation Age of Onset   Asthma Other     Current Outpatient Medications:    acetaminophen  (TYLENOL ) 500 MG tablet, Take 2 tablets (1,000 mg total) by mouth every 6 (six) hours as needed for mild pain or moderate pain., Disp: 60 tablet, Rfl: 0  amLODipine  (NORVASC ) 5 MG tablet, Take 2 tablets (10 mg total) by mouth daily., Disp: , Rfl:    ascorbic acid (VITAMIN C) 500 MG tablet, Take 500 mg by mouth daily., Disp: , Rfl:    aspirin  EC 81 MG tablet, Take 1 tablet (81 mg total) by  mouth 2 (two) times daily. To prevent blood clots for 30 days after surgery., Disp: 60 tablet, Rfl: 0   atorvastatin (LIPITOR) 20 MG tablet, Take 20 mg by mouth daily., Disp: , Rfl:    Cholecalciferol (D3 ADULT PO), Take 1,000 Int'l Units by mouth daily., Disp: , Rfl:    ferrous sulfate 325 (65 FE) MG tablet, Take 325 mg by mouth daily with breakfast., Disp: , Rfl:    fluticasone  (FLONASE) 50 MCG/ACT nasal spray, Place 2 sprays into both nostrils in the morning and at bedtime., Disp: , Rfl:    fluticasone -salmeterol (ADVAIR HFA) 230-21 MCG/ACT inhaler, Inhale 2 puffs into the lungs 2 (two) times daily., Disp: 36 g, Rfl: 2   glipiZIDE (GLUCOTROL XL) 5 MG 24 hr tablet, Take 5 mg by mouth daily with breakfast., Disp: , Rfl:    ipratropium (ATROVENT ) 0.06 % nasal spray, Place 2 sprays into both nostrils 4 (four) times daily., Disp: 15 mL, Rfl: 1   loratadine  (CLARITIN ) 10 MG tablet, Take 1 tablet (10 mg total) by mouth daily., Disp: 30 tablet, Rfl: 11   losartan (COZAAR) 100 MG tablet, Take 100 mg by mouth daily., Disp: , Rfl:    metFORMIN (GLUCOPHAGE) 500 MG tablet, Take 500 mg by mouth daily. , Disp: , Rfl:    metoprolol succinate (TOPROL-XL) 50 MG 24 hr tablet, Take 50 mg by mouth daily. Take with or immediately following a meal., Disp: , Rfl:    MYRBETRIQ 50 MG TB24 tablet, Take 50 mg by mouth daily., Disp: , Rfl:    chlorthalidone  (HYGROTON ) 25 MG tablet, Take 0.5 tablets (12.5 mg total) by mouth daily., Disp: , Rfl:    pantoprazole (PROTONIX) 40 MG tablet, Take 1 tablet (40 mg total) by mouth daily., Disp: 90 tablet, Rfl: 3 Allergies  Allergen Reactions   Penicillins Rash     ROS: A complete ROS was performed with pertinent positives/negatives noted in the HPI. The remainder of the ROS are negative.    Objective:   Today's Vitals   04/25/24 0803  BP: 136/86  Pulse: 91  Temp: 97.6 F (36.4 C)  TempSrc: Temporal  SpO2: 97%  Weight: 146 lb (66.2 kg)  Height: 5\' 1"  (1.549 m)     GENERAL: Well-appearing, in NAD. Well nourished.  SKIN: Pink, warm and dry.  RESPIRATORY: Chest wall symmetrical. Respirations even and non-labored. Breath sounds clear to auscultation bilaterally.  CARDIAC: S1, S2 present, regular rate and rhythm. Peripheral pulses 2+ bilaterally.  EXTREMITIES: Without clubbing, cyanosis, or edema.  PSYCH/MENTAL STATUS: Alert, oriented x 3. Cooperative, appropriate mood and affect.   Health Maintenance Due  Topic Date Due   Medicare Annual Wellness (AWV)  Never done   DEXA SCAN  Never done    No results found for any visits on 04/25/24.  The 10-year ASCVD risk score (Arnett DK, et al., 2019) is: 20.5%     Assessment & Plan:  Assessment and Plan    Hypertension Blood pressure improved with current regimen. Nocturia likely due to chlorthalidone .  - Decrease chlorthalidone  to 12.5 mg daily. - Continue amlodipine  10 mg daily. - Continue losartan 100 mg daily. - Continue metoprolol 50 mg daily. - Check BMP -  Follow up in 4 weeks to reassess.  Gastroesophageal reflux disease (GERD) Stomach upset with certain foods. Requested pantoprazole. - Prescribe pantoprazole 40mg  PO daily      Orders Placed This Encounter  Procedures   Basic Metabolic Panel (BMET)   No images are attached to the encounter or orders placed in the encounter. Meds ordered this encounter  Medications   chlorthalidone  (HYGROTON ) 25 MG tablet    Sig: Take 0.5 tablets (12.5 mg total) by mouth daily.    Supervising Provider:   THOMPSON, AARON B [[id number]]   pantoprazole (PROTONIX) 40 MG tablet    Sig: Take 1 tablet (40 mg total) by mouth daily.    Dispense:  90 tablet    Refill:  3    Supervising Provider:   THOMPSON, AARON B [[id number]]    Return in about 4 weeks (around 05/23/2024) for Hypertension.   Gavin Kast, FNP

## 2024-04-26 NOTE — Telephone Encounter (Signed)
 Patient is calling in regarding her medication it is to expensive and the insurance is not gonna cover the medication  chlorthalidone  she would like a call back

## 2024-04-28 ENCOUNTER — Other Ambulatory Visit: Payer: Self-pay | Admitting: Internal Medicine

## 2024-04-28 ENCOUNTER — Telehealth: Payer: Self-pay

## 2024-04-28 DIAGNOSIS — I10 Essential (primary) hypertension: Secondary | ICD-10-CM

## 2024-04-28 MED ORDER — HYDROCHLOROTHIAZIDE 12.5 MG PO TABS: 12.5000 mg | ORAL_TABLET | Freq: Every day | ORAL | 0 refills | Status: DC

## 2024-04-28 NOTE — Telephone Encounter (Signed)
 LVM for pt informing her off the script and pharmacy location. Advised to send Cheryl Huang message or call with questions or concerns.

## 2024-04-28 NOTE — Telephone Encounter (Signed)
 Switch to hydrochlorothiazide 12.5 mg p.o. daily

## 2024-04-28 NOTE — Telephone Encounter (Signed)
 Copied from CRM [id number]. Topic: Clinical - Prescription Issue >> Apr 28, 2024 11:00 AM Cheryl Huang wrote: Reason for CRM: [facility] pharmacy called regarding chlorthalidone  (THALITONE ) 15 MG tablet states pt insurance does not cover the 15mg  because it does not come in a generic. Requesting a call back at [phone] would like the provider to prescribe something else

## 2024-04-28 NOTE — Telephone Encounter (Signed)
 Called the patient's pharmacy back and spoke with Whitney. I informed her of Gavin Kast, FNP recommendation to switch to Hydrochlorothiazide 12.5 mg daily. She thanked me for call and stated that she will let the pharmacist know of the change approval.

## 2024-05-09 NOTE — Progress Notes (Unsigned)
 [facility] [address] [place] [address] Dept: [phone] Dept Fax: [phone]    Subjective:   Cheryl Huang June 29, 1961 05/10/2024  No chief complaint on file.   HPI: Cheryl Huang presents today for re-assessment and management of chronic medical conditions.        The following portions of the patient's history were reviewed and updated as appropriate: past medical history, past surgical history, family history, social history, allergies, medications, and problem list.   Patient Active Problem List   Diagnosis Date Noted   Microalbuminuria 04/14/2024   Acid reflux 04/11/2024   Nuclear sclerotic cataract of both eyes 10/26/2023   Epiretinal membrane (ERM) of both eyes 10/26/2023   Peripheral focal chorioretinal inflammation of both eyes 10/26/2023   Retinal edema 10/26/2023   Osteoarthritis of lumbar spine 10/04/2023   Primary osteoarthritis of right hip 10/04/2023   S/P total left hip arthroplasty 07/13/2023   Iron deficiency anemia 05/17/2023   Non-seasonal allergic rhinitis 05/17/2023   Vitamin D  deficiency 05/17/2023   Restrictive lung disease 03/17/2023   Hypertension associated with type 2 diabetes mellitus (HCC) 05/20/2022   OAB (overactive bladder) 05/20/2022   Type 2 diabetes mellitus without complication, without long-term current use of insulin  (HCC) 05/20/2022   Hyperlipidemia associated with type 2 diabetes mellitus (HCC) 05/20/2022   Lactose intolerance, unspecified 05/20/2022   Mild persistent asthma without complication 04/16/2022   Hyperparathyroidism, primary (HCC) 08/14/2020   Pap smear of cervix shows high risk HPV present 06/30/2018   Past Medical History:  Diagnosis Date   Anemia    Asthma    Complication of anesthesia    Diabetes mellitus without complication (HCC)    Hyperlipidemia    Hypertension    Past Surgical History:  Procedure Laterality Date   CSF SHUNT      states had shunt for her brain 17 yrs ago   PARATHYROIDECTOMY Right 08/15/2020   Procedure: RIGHT PARATHYROIDECTOMY;  Surgeon: Oralee Billow, MD;  Location: [facility];  Service: General;  Laterality: Right;   TOTAL HIP ARTHROPLASTY Left 07/13/2023   Procedure: TOTAL HIP ARTHROPLASTY ANTERIOR APPROACH;  Surgeon: Saundra Curl, MD;  Location: [facility];  Service: Orthopedics;  Laterality: Left;   Family History  Problem Relation Age of Onset   Asthma Other     Current Outpatient Medications:    hydrochlorothiazide  (HYDRODIURIL ) 12.5 MG tablet, Take 1 tablet (12.5 mg total) by mouth daily., Disp: 90 tablet, Rfl: 0   acetaminophen  (TYLENOL ) 500 MG tablet, Take 2 tablets (1,000 mg total) by mouth every 6 (six) hours as needed for mild pain or moderate pain., Disp: 60 tablet, Rfl: 0   amLODipine  (NORVASC ) 5 MG tablet, Take 2 tablets (10 mg total) by mouth daily., Disp: , Rfl:    ascorbic acid (VITAMIN C) 500 MG tablet, Take 500 mg by mouth daily., Disp: , Rfl:    aspirin  EC 81 MG tablet, Take 1 tablet (81 mg total) by mouth 2 (two) times daily. To prevent blood clots for 30 days after surgery., Disp: 60 tablet, Rfl: 0   atorvastatin (LIPITOR) 20 MG tablet, Take 20 mg by mouth daily., Disp: , Rfl:    Cholecalciferol (D3 ADULT PO), Take 1,000 Int'l Units by mouth daily., Disp: , Rfl:    ferrous sulfate 325 (65 FE) MG tablet, Take 325 mg by mouth daily with breakfast., Disp: , Rfl:    fluticasone  (FLONASE) 50 MCG/ACT nasal spray, Place 2 sprays into both nostrils in the  morning and at bedtime., Disp: , Rfl:    fluticasone -salmeterol (ADVAIR HFA) 230-21 MCG/ACT inhaler, Inhale 2 puffs into the lungs 2 (two) times daily., Disp: 36 g, Rfl: 2   glipiZIDE (GLUCOTROL XL) 5 MG 24 hr tablet, Take 5 mg by mouth daily with breakfast., Disp: , Rfl:    ipratropium (ATROVENT ) 0.06 % nasal spray, Place 2 sprays into both nostrils 4 (four) times daily., Disp: 15 mL, Rfl: 1   loratadine  (CLARITIN ) 10 MG tablet, Take 1  tablet (10 mg total) by mouth daily., Disp: 30 tablet, Rfl: 11   losartan (COZAAR) 100 MG tablet, Take 100 mg by mouth daily., Disp: , Rfl:    metFORMIN (GLUCOPHAGE) 500 MG tablet, Take 500 mg by mouth daily. , Disp: , Rfl:    metoprolol succinate (TOPROL-XL) 50 MG 24 hr tablet, Take 50 mg by mouth daily. Take with or immediately following a meal., Disp: , Rfl:    MYRBETRIQ 50 MG TB24 tablet, Take 50 mg by mouth daily., Disp: , Rfl:    pantoprazole  (PROTONIX ) 40 MG tablet, Take 1 tablet (40 mg total) by mouth daily., Disp: 90 tablet, Rfl: 3 Allergies  Allergen Reactions   Penicillins Rash     ROS: A complete ROS was performed with pertinent positives/negatives noted in the HPI. The remainder of the ROS are negative.    Objective:   There were no vitals filed for this visit.  GENERAL: Well-appearing, in NAD. Well nourished.  SKIN: Pink, warm and dry. No rash, lesion, ulceration, or ecchymoses.  NECK: Trachea midline. Full ROM w/o pain or tenderness. No lymphadenopathy.  RESPIRATORY: Chest wall symmetrical. Respirations even and non-labored. Breath sounds clear to auscultation bilaterally.  CARDIAC: S1, S2 present, regular rate and rhythm. Peripheral pulses 2+ bilaterally.  MSK: Muscle tone and strength appropriate for age. Joints w/o tenderness, redness, or swelling.  EXTREMITIES: Without clubbing, cyanosis, or edema.  NEUROLOGIC: No motor or sensory deficits. Steady, even gait.  PSYCH/MENTAL STATUS: Alert, oriented x 3. Cooperative, appropriate mood and affect.   Health Maintenance Due  Topic Date Due   Medicare Annual Wellness (AWV)  Never done   COVID-19 Vaccine (3 - Pfizer risk series) 08/10/2020   DEXA SCAN  Never done    No results found for any visits on 05/10/24.  The 10-year ASCVD risk score (Arnett DK, et al., 2019) is: 20.5%     Assessment & Plan:   No orders of the defined types were placed in this encounter.  No images are attached to the encounter or orders  placed in the encounter. No orders of the defined types were placed in this encounter.   No follow-ups on file.   Gavin Kast, FNP

## 2024-05-10 ENCOUNTER — Encounter: Payer: Self-pay | Admitting: Internal Medicine

## 2024-05-10 ENCOUNTER — Ambulatory Visit (INDEPENDENT_AMBULATORY_CARE_PROVIDER_SITE_OTHER): Admitting: Internal Medicine

## 2024-05-10 VITALS — BP 146/80 | HR 83 | Temp 97.6°F | Ht 61.0 in | Wt 148.4 lb

## 2024-05-10 DIAGNOSIS — E1159 Type 2 diabetes mellitus with other circulatory complications: Secondary | ICD-10-CM

## 2024-05-10 DIAGNOSIS — Z1231 Encounter for screening mammogram for malignant neoplasm of breast: Secondary | ICD-10-CM

## 2024-05-10 DIAGNOSIS — I152 Hypertension secondary to endocrine disorders: Secondary | ICD-10-CM

## 2024-05-10 LAB — BASIC METABOLIC PANEL WITH GFR
BUN: 20 mg/dL (ref 6–23)
CO2: 31 meq/L (ref 19–32)
Calcium: 10.1 mg/dL (ref 8.4–10.5)
Chloride: 101 meq/L (ref 96–112)
Creatinine, Ser: 0.85 mg/dL (ref 0.40–1.20)
GFR: 71.97 mL/min (ref >60.00)
Glucose, Bld: 212 mg/dL — ABNORMAL HIGH (ref 70–99)
Potassium: 3.5 meq/L (ref 3.5–5.1)
Sodium: 140 meq/L (ref 135–145)

## 2024-05-12 ENCOUNTER — Encounter: Payer: Self-pay | Admitting: Internal Medicine

## 2024-05-12 ENCOUNTER — Ambulatory Visit: Payer: Self-pay | Admitting: Internal Medicine

## 2024-05-17 ENCOUNTER — Other Ambulatory Visit: Payer: Self-pay | Admitting: Internal Medicine

## 2024-05-17 DIAGNOSIS — E1159 Type 2 diabetes mellitus with other circulatory complications: Secondary | ICD-10-CM

## 2024-05-23 ENCOUNTER — Ambulatory Visit: Admitting: Internal Medicine

## 2024-06-14 LAB — HM MAMMOGRAPHY

## 2024-06-19 ENCOUNTER — Ambulatory Visit: Payer: Self-pay | Admitting: Internal Medicine

## 2024-06-20 ENCOUNTER — Other Ambulatory Visit: Payer: Self-pay | Admitting: Internal Medicine

## 2024-06-20 DIAGNOSIS — I10 Essential (primary) hypertension: Secondary | ICD-10-CM

## 2024-06-20 NOTE — Telephone Encounter (Signed)
Is it ok to refill? 

## 2024-06-20 NOTE — Telephone Encounter (Unsigned)
 Copied from CRM [id number]. Topic: Clinical - Medication Refill >> Jun 20, 2024  4:18 PM Abigail D wrote: Medication: glipiZIDE  (GLUCOTROL  XL) 5 MG 24 hr tablet metFORMIN  (GLUCOPHAGE ) 500 MG tablet losartan  (COZAAR ) 100 MG tablet hydrochlorothiazide  (HYDRODIURIL ) 12.5 MG tablet ferrous sulfate  325 (65 FE) MG tablet  Needs filled up until her upcoming appt.   Has the patient contacted their pharmacy? Yes (Agent: If no, request that the patient contact the pharmacy for the refill. If patient does not wish to contact the pharmacy document the reason why and proceed with request.) (Agent: If yes, when and what did the pharmacy advise?)  This is the patient's preferred pharmacy:  [id number] - [place], [place] - [address] [place] [address] Phone: [phone] Fax: [phone]  Is this the correct pharmacy for this prescription? Yes If no, delete pharmacy and type the correct one.   Has the prescription been filled recently? Yes  Is the patient out of the medication? No, running low  Has the patient been seen for an appointment in the last year OR does the patient have an upcoming appointment? Yes  Can we respond through MyChart? Yes  Agent: Please be advised that Rx refills may take up to 3 business days. We ask that you follow-up with your pharmacy.

## 2024-06-21 MED ORDER — METFORMIN HCL 500 MG PO TABS: 500.0000 mg | ORAL_TABLET | Freq: Every day | ORAL | 1 refills | Status: DC

## 2024-06-21 MED ORDER — HYDROCHLOROTHIAZIDE 12.5 MG PO TABS
12.5000 mg | ORAL_TABLET | Freq: Every day | ORAL | 0 refills | Status: DC
Start: 2024-06-21 — End: 2024-08-24

## 2024-06-21 MED ORDER — LOSARTAN POTASSIUM 100 MG PO TABS: 100.0000 mg | ORAL_TABLET | Freq: Every day | ORAL | 1 refills | Status: DC

## 2024-06-21 MED ORDER — GLIPIZIDE ER 5 MG PO TB24: 5.0000 mg | ORAL_TABLET | Freq: Every day | ORAL | 1 refills | Status: DC

## 2024-06-21 MED ORDER — FERROUS SULFATE 325 (65 FE) MG PO TABS: 325.0000 mg | ORAL_TABLET | Freq: Every day | ORAL | 1 refills | Status: AC

## 2024-08-09 NOTE — Progress Notes (Unsigned)
 [facility] [address] [place] [address] Dept: [phone] Dept Fax: [phone]    Subjective:   Cheryl Huang 30-Aug-1961 08/10/2024  No chief complaint on file.   HPI: Cheryl Huang presents today for re-assessment and management of chronic medical conditions.     The following portions of the patient's history were reviewed and updated as appropriate: past medical history, past surgical history, family history, social history, allergies, medications, and problem list.   Patient Active Problem List   Diagnosis Date Noted   Microalbuminuria 04/14/2024   Acid reflux 04/11/2024   Nuclear sclerotic cataract of both eyes 10/26/2023   Epiretinal membrane (ERM) of both eyes 10/26/2023   Peripheral focal chorioretinal inflammation of both eyes 10/26/2023   Retinal edema 10/26/2023   Osteoarthritis of lumbar spine 10/04/2023   Primary osteoarthritis of right hip 10/04/2023   S/P total left hip arthroplasty 07/13/2023   Iron deficiency anemia 05/17/2023   Non-seasonal allergic rhinitis 05/17/2023   Vitamin D  deficiency 05/17/2023   Restrictive lung disease 03/17/2023   Hypertension associated with type 2 diabetes mellitus (HCC) 05/20/2022   OAB (overactive bladder) 05/20/2022   Type 2 diabetes mellitus without complication, without long-term current use of insulin  (HCC) 05/20/2022   Hyperlipidemia associated with type 2 diabetes mellitus (HCC) 05/20/2022   Lactose intolerance, unspecified 05/20/2022   Mild persistent asthma without complication 04/16/2022   Hyperparathyroidism, primary (HCC) 08/14/2020   Pap smear of cervix shows high risk HPV present 06/30/2018   Past Medical History:  Diagnosis Date   Anemia    Asthma    Complication of anesthesia    Diabetes mellitus without complication (HCC)    Hyperlipidemia    Hypertension    Past Surgical History:  Procedure Laterality Date   CSF SHUNT      states had shunt for her brain 17 yrs ago   PARATHYROIDECTOMY Right 08/15/2020   Procedure: RIGHT PARATHYROIDECTOMY;  Surgeon: Eletha Boas, MD;  Location: [facility];  Service: General;  Laterality: Right;   TOTAL HIP ARTHROPLASTY Left 07/13/2023   Procedure: TOTAL HIP ARTHROPLASTY ANTERIOR APPROACH;  Surgeon: Beverley Evalene BIRCH, MD;  Location: [facility];  Service: Orthopedics;  Laterality: Left;   Family History  Problem Relation Age of Onset   Asthma Other     Current Outpatient Medications:    acetaminophen  (TYLENOL ) 500 MG tablet, Take 2 tablets (1,000 mg total) by mouth every 6 (six) hours as needed for mild pain or moderate pain., Disp: 60 tablet, Rfl: 0   amLODipine  (NORVASC ) 5 MG tablet, Take 2 tablets (10 mg total) by mouth daily., Disp: 180 tablet, Rfl: 2   ascorbic acid (VITAMIN C) 500 MG tablet, Take 500 mg by mouth daily., Disp: , Rfl:    aspirin  EC 81 MG tablet, Take 1 tablet (81 mg total) by mouth 2 (two) times daily. To prevent blood clots for 30 days after surgery., Disp: 60 tablet, Rfl: 0   atorvastatin (LIPITOR) 20 MG tablet, Take 20 mg by mouth daily., Disp: , Rfl:    Cholecalciferol (D3 ADULT PO), Take 1,000 Int'l Units by mouth daily., Disp: , Rfl:    ferrous sulfate  325 (65 FE) MG tablet, Take 1 tablet (325 mg total) by mouth daily with breakfast., Disp: 90 tablet, Rfl: 1   fluticasone  (FLONASE) 50 MCG/ACT nasal spray, Place 2 sprays into both nostrils in the morning and at bedtime., Disp: , Rfl:    fluticasone -salmeterol (ADVAIR HFA) 230-21 MCG/ACT inhaler, Inhale 2 puffs  into the lungs 2 (two) times daily., Disp: 36 g, Rfl: 2   glipiZIDE  (GLUCOTROL  XL) 5 MG 24 hr tablet, Take 1 tablet (5 mg total) by mouth daily with breakfast., Disp: 90 tablet, Rfl: 1   hydrochlorothiazide  (HYDRODIURIL ) 12.5 MG tablet, Take 1 tablet (12.5 mg total) by mouth daily., Disp: 90 tablet, Rfl: 0   ipratropium (ATROVENT ) 0.06 % nasal spray, Place 2 sprays into both nostrils 4 (four) times daily., Disp:  15 mL, Rfl: 1   loratadine  (CLARITIN ) 10 MG tablet, Take 1 tablet (10 mg total) by mouth daily., Disp: 30 tablet, Rfl: 11   losartan  (COZAAR ) 100 MG tablet, Take 1 tablet (100 mg total) by mouth daily., Disp: 90 tablet, Rfl: 1   metFORMIN  (GLUCOPHAGE ) 500 MG tablet, Take 1 tablet (500 mg total) by mouth daily., Disp: 90 tablet, Rfl: 1   metoprolol succinate (TOPROL-XL) 50 MG 24 hr tablet, Take 50 mg by mouth daily. Take with or immediately following a meal., Disp: , Rfl:    MYRBETRIQ 50 MG TB24 tablet, Take 50 mg by mouth daily., Disp: , Rfl:    pantoprazole  (PROTONIX ) 40 MG tablet, Take 1 tablet (40 mg total) by mouth daily., Disp: 90 tablet, Rfl: 3 Allergies  Allergen Reactions   Penicillins Rash     ROS: A complete ROS was performed with pertinent positives/negatives noted in the HPI. The remainder of the ROS are negative.    Objective:   There were no vitals filed for this visit.  GENERAL: Well-appearing, in NAD. Well nourished.  SKIN: Pink, warm and dry. No rash, lesion, ulceration, or ecchymoses.  NECK: Trachea midline. Full ROM w/o pain or tenderness. No lymphadenopathy.  RESPIRATORY: Chest wall symmetrical. Respirations even and non-labored. Breath sounds clear to auscultation bilaterally.  CARDIAC: S1, S2 present, regular rate and rhythm. Peripheral pulses 2+ bilaterally.  MSK: Muscle tone and strength appropriate for age. Joints w/o tenderness, redness, or swelling.  EXTREMITIES: Without clubbing, cyanosis, or edema.  NEUROLOGIC: No motor or sensory deficits. Steady, even gait.  PSYCH/MENTAL STATUS: Alert, oriented x 3. Cooperative, appropriate mood and affect.   Health Maintenance Due  Topic Date Due   Medicare Annual Wellness (AWV)  Never done   Cervical Cancer Screening (HPV/Pap Cotest)  04/14/2020   COVID-19 Vaccine (3 - Pfizer risk series) 08/10/2020   DEXA SCAN  Never done   INFLUENZA VACCINE  07/14/2024    No results found for any visits on 08/10/24.  The  10-year ASCVD risk score (Arnett DK, et al., 2019) is: 23.9%     Assessment & Plan:    There are no diagnoses linked to this encounter. No orders of the defined types were placed in this encounter.  No images are attached to the encounter or orders placed in the encounter. No orders of the defined types were placed in this encounter.   No follow-ups on file.   Rosina Senters, FNP

## 2024-08-10 ENCOUNTER — Ambulatory Visit (INDEPENDENT_AMBULATORY_CARE_PROVIDER_SITE_OTHER): Admitting: Internal Medicine

## 2024-08-10 ENCOUNTER — Encounter: Payer: Self-pay | Admitting: Internal Medicine

## 2024-08-10 VITALS — BP 136/76 | HR 78 | Temp 98.8°F | Ht 61.0 in | Wt 149.4 lb

## 2024-08-10 DIAGNOSIS — Z131 Encounter for screening for diabetes mellitus: Secondary | ICD-10-CM | POA: Diagnosis not present

## 2024-08-10 DIAGNOSIS — E119 Type 2 diabetes mellitus without complications: Secondary | ICD-10-CM

## 2024-08-10 DIAGNOSIS — E1169 Type 2 diabetes mellitus with other specified complication: Secondary | ICD-10-CM

## 2024-08-10 DIAGNOSIS — Z13220 Encounter for screening for lipoid disorders: Secondary | ICD-10-CM | POA: Diagnosis not present

## 2024-08-10 DIAGNOSIS — Z0000 Encounter for general adult medical examination without abnormal findings: Secondary | ICD-10-CM

## 2024-08-10 DIAGNOSIS — E559 Vitamin D deficiency, unspecified: Secondary | ICD-10-CM

## 2024-08-10 DIAGNOSIS — E1159 Type 2 diabetes mellitus with other circulatory complications: Secondary | ICD-10-CM

## 2024-08-10 LAB — LIPID PANEL
Cholesterol: 172 mg/dL (ref 0–200)
HDL: 49.4 mg/dL (ref >39.00)
LDL Cholesterol: 107 mg/dL — ABNORMAL HIGH (ref 0–99)
NonHDL: 122.17
Total CHOL/HDL Ratio: 3
Triglycerides: 74 mg/dL (ref 0.0–149.0)
VLDL: 14.8 mg/dL (ref 0.0–40.0)

## 2024-08-10 LAB — COMPREHENSIVE METABOLIC PANEL WITH GFR
ALT: 19 U/L (ref 0–35)
AST: 19 U/L (ref 0–37)
Albumin: 4.5 g/dL (ref 3.5–5.2)
Alkaline Phosphatase: 66 U/L (ref 39–117)
BUN: 18 mg/dL (ref 6–23)
CO2: 27 meq/L (ref 19–32)
Calcium: 9.6 mg/dL (ref 8.4–10.5)
Chloride: 99 meq/L (ref 96–112)
Creatinine, Ser: 0.82 mg/dL (ref 0.40–1.20)
GFR: 75.01 mL/min (ref >60.00)
Glucose, Bld: 117 mg/dL — ABNORMAL HIGH (ref 70–99)
Potassium: 3.7 meq/L (ref 3.5–5.1)
Sodium: 138 meq/L (ref 135–145)
Total Bilirubin: 0.5 mg/dL (ref 0.2–1.2)
Total Protein: 8.1 g/dL (ref 6.0–8.3)

## 2024-08-10 LAB — HEMOGLOBIN A1C: Hgb A1c MFr Bld: 6.9 % — ABNORMAL HIGH (ref 4.6–6.5)

## 2024-08-10 LAB — TSH: TSH: 1.73 u[IU]/mL (ref 0.35–5.50)

## 2024-08-15 ENCOUNTER — Ambulatory Visit: Payer: Self-pay | Admitting: Internal Medicine

## 2024-08-24 ENCOUNTER — Other Ambulatory Visit: Payer: Self-pay | Admitting: Internal Medicine

## 2024-08-24 DIAGNOSIS — I10 Essential (primary) hypertension: Secondary | ICD-10-CM

## 2024-08-24 NOTE — Telephone Encounter (Unsigned)
 Copied from CRM #[id number]. Topic: Clinical - Medication Refill >> Aug 24, 2024 11:43 AM Robinson H wrote: Medication: metoprolol  succinate (TOPROL -XL) 50 MG 24 hr tablet, hydrochlorothiazide  (HYDRODIURIL ) 12.5 MG tablet, metFORMIN  (GLUCOPHAGE ) 500 MG tablet,   Has the patient contacted their pharmacy? Yes, stats was told office denied refills (Agent: If no, request that the patient contact the pharmacy for the refill. If patient does not wish to contact the pharmacy document the reason why and proceed with request.) (Agent: If yes, when and what did the pharmacy advise?)  This is the patient's preferred pharmacy:  [id number] - [place], [place] - [address] [place] [address] Phone: [phone] Fax: [phone]  Is this the correct pharmacy for this prescription? Yes If no, delete pharmacy and type the correct one.   Has the prescription been filled recently? No  Is the patient out of the medication? Yes  Has the patient been seen for an appointment in the last year OR does the patient have an upcoming appointment? Yes  Can we respond through MyChart? Yes  Agent: Please be advised that Rx refills may take up to 3 business days. We ask that you follow-up with your pharmacy.

## 2024-08-25 ENCOUNTER — Ambulatory Visit (HOSPITAL_BASED_OUTPATIENT_CLINIC_OR_DEPARTMENT_OTHER): Admitting: Pulmonary Disease

## 2024-08-25 ENCOUNTER — Encounter (HOSPITAL_BASED_OUTPATIENT_CLINIC_OR_DEPARTMENT_OTHER): Payer: Self-pay | Admitting: Pulmonary Disease

## 2024-08-25 VITALS — BP 132/74 | HR 77 | Ht 61.0 in | Wt 150.0 lb

## 2024-08-25 DIAGNOSIS — J984 Other disorders of lung: Secondary | ICD-10-CM

## 2024-08-25 DIAGNOSIS — R0981 Nasal congestion: Secondary | ICD-10-CM

## 2024-08-25 MED ORDER — METOPROLOL SUCCINATE ER 50 MG PO TB24: 50.0000 mg | ORAL_TABLET | Freq: Every day | ORAL | 3 refills | Status: AC

## 2024-08-25 MED ORDER — HYDROCHLOROTHIAZIDE 12.5 MG PO TABS: 12.5000 mg | ORAL_TABLET | Freq: Every day | ORAL | 3 refills | Status: AC

## 2024-08-25 MED ORDER — METFORMIN HCL 500 MG PO TABS: 500.0000 mg | ORAL_TABLET | Freq: Every day | ORAL | 3 refills | Status: AC

## 2024-08-25 MED ORDER — IPRATROPIUM BROMIDE 0.06 % NA SOLN: 2.0000 | Freq: Four times a day (QID) | NASAL | 1 refills | Status: AC

## 2024-08-25 NOTE — Progress Notes (Signed)
 Subjective:   PATIENT ID: Cheryl Huang GENDER: female DOB: 03-18-1961, MRN: [id number]   HPI  Chief Complaint  Patient presents with   Follow-up    Reason for Visit: Follow-up asthma  [name] is a 63 year old female never smoker with HTN, DM2, HLD, hx CSF shunt in 1999 who presents for follow-up  Initial consult For many years she has bronchitis like symptoms with wheezing usually during colder weather. In th last five months she has had worsening sputum production that improves with clearing her throat however it recurs. Worsens when laying down. Has some wheezing. Denies coughing spells. Denies shortness of breath. When she completed PFTs she reports that it improved.  Denies childhood asthma. Denies nasal congestion.  06/24/22 Since starting her Symbicort  and also starting fluticasone  nasal spray she reports improved symptoms. No longer having productive cough except for morning sputum. No longer wheezing. Denies shortness of breath. Her activity is limited due to plans for left hip replacement. But able to ambulate within the house and perform ADLs. Has difficulty with long distances such as walking to the grocery store.  03/17/23 She is compliant with her Symbicort . No exacerbations since our last visit. Does feel short of breath at night. She has unchanged mild mucous productive that is unchanged. Some wheezing at night. Will feel choked up. This occurs nightly. During the day her symptoms are well controlled. Is planning for left hip surgery this summer.  05/24/23 She reports improved shortness of breath and wheezing but still having sensation of mucous in her throat. Will cough. She has been compliant with her Advair and flonase twice a day. Otherwise overall doing well. She believes she is on singulair but it is not on her med list.  12/29/23 Since our last visit she is using Advair as needed. Not needing rescue inhaler. No exacerbations since last visit. She does  have allergies and reports astelin is not working as well. Not taking any allergy medication or singulair  08/25/24 Since our last visit she reports overall doing well. Has congestion in her throat and produces phlegm in the morning. Occasional wheeze at night. Rarely uses her advair and albuterol  in summer. She anticipates she will need it more in the winter. Has Advair HFA on storage if needed. No exacerbations since our last visit.   Asthma Control Test ACT Total Score  08/25/2024  9:29 AM 23  12/29/2023  1:03 PM 24  05/24/2023  8:45 AM 23   Social History: Never smoker Multiple family members with asthma Previously worked in [employer] x 5  Past Medical History:  Diagnosis Date   Anemia    Asthma    Complication of anesthesia    Diabetes mellitus without complication (HCC)    Hyperlipidemia    Hypertension      Family History  Problem Relation Age of Onset   Asthma Other      Social History   Occupational History   Not on file  Tobacco Use   Smoking status: Never   Smokeless tobacco: Never  Vaping Use   Vaping status: Never Used  Substance and Sexual Activity   Alcohol use: No   Drug use: No   Sexual activity: Not on file    Allergies  Allergen Reactions   Penicillins Rash     Outpatient Medications Prior to Visit  Medication Sig Dispense Refill   acetaminophen  (TYLENOL ) 500 MG tablet Take 2 tablets (1,000 mg total) by mouth every 6 (six) hours as  needed for mild pain or moderate pain. 60 tablet 0   amLODipine  (NORVASC ) 5 MG tablet Take 2 tablets (10 mg total) by mouth daily. 180 tablet 2   ascorbic acid (VITAMIN C) 500 MG tablet Take 500 mg by mouth daily.     aspirin  EC 81 MG tablet Take 1 tablet (81 mg total) by mouth 2 (two) times daily. To prevent blood clots for 30 days after surgery. 60 tablet 0   atorvastatin (LIPITOR) 20 MG tablet Take 20 mg by mouth daily.     Cholecalciferol (D3 ADULT PO) Take 1,000 Int'l Units by mouth daily.     ferrous sulfate   325 (65 FE) MG tablet Take 1 tablet (325 mg total) by mouth daily with breakfast. 90 tablet 1   fluticasone  (FLONASE) 50 MCG/ACT nasal spray Place 2 sprays into both nostrils in the morning and at bedtime.     fluticasone -salmeterol (ADVAIR HFA) 230-21 MCG/ACT inhaler Inhale 2 puffs into the lungs 2 (two) times daily. 36 g 2   glipiZIDE  (GLUCOTROL  XL) 5 MG 24 hr tablet Take 1 tablet (5 mg total) by mouth daily with breakfast. 90 tablet 1   hydrochlorothiazide  (HYDRODIURIL ) 12.5 MG tablet Take 1 tablet (12.5 mg total) by mouth daily. 90 tablet 3   loratadine  (CLARITIN ) 10 MG tablet Take 1 tablet (10 mg total) by mouth daily. 30 tablet 11   losartan  (COZAAR ) 100 MG tablet Take 1 tablet (100 mg total) by mouth daily. 90 tablet 1   metFORMIN  (GLUCOPHAGE ) 500 MG tablet Take 1 tablet (500 mg total) by mouth daily. 90 tablet 3   metoprolol  succinate (TOPROL -XL) 50 MG 24 hr tablet Take 1 tablet (50 mg total) by mouth daily. Take with or immediately following a meal. 90 tablet 3   MYRBETRIQ 50 MG TB24 tablet Take 50 mg by mouth daily.     pantoprazole  (PROTONIX ) 40 MG tablet Take 1 tablet (40 mg total) by mouth daily. 90 tablet 3   ipratropium (ATROVENT ) 0.06 % nasal spray Place 2 sprays into both nostrils 4 (four) times daily. 15 mL 1   No facility-administered medications prior to visit.    Review of Systems  Constitutional:  Negative for chills, diaphoresis, fever, malaise/fatigue and weight loss.  HENT:  Negative for congestion.   Respiratory:  Negative for cough, hemoptysis, sputum production, shortness of breath and wheezing.   Cardiovascular:  Negative for chest pain, palpitations and leg swelling.     Objective:   Vitals:   08/25/24 0926  BP: (!) 147/71  Pulse: 77  SpO2: 100%  Weight: 150 lb (68 kg)  Height: 5' 1 (1.549 m)   SpO2: 100 %  Physical Exam: General: Well-appearing, no acute distress HENT: [place], AT Eyes: EOMI, no scleral icterus Respiratory: Clear to auscultation  bilaterally.  No crackles, wheezing or rales Cardiovascular: RRR, -M/R/G, no JVD Extremities:-Edema,-tenderness Neuro: AAO x4, CNII-XII grossly intact Psych: Normal mood, normal affect  Data Reviewed:  Imaging: CXR 03/24/22 - no acute process CXR 03/13/24 - No acute cardiopulmonary process  PFT: 03/27/22 FVC 1.85 (82%) FEV1 1.74 (99%) Ratio 74  TLC 76% DLCO 77% Interpretation: No obstructive defect on spirometry however significant bronchodilator response in FEV1 indicative of asthma. Co-comitant mild restrictive defect with mild DLCO.  03/17/23 FVC 1.59 (56%) FEV1 1.35 (62%) Ratio 85  TLC 66% DLCO 82% Interpretation: Moderate restrictive defect with normal DLCO. No significant BD response   Labs: CBC    Component Value Date/Time   WBC 5.3 04/11/2024 0906  RBC 4.34 04/11/2024 0906   HGB 11.7 (L) 04/11/2024 0906   HCT 36.2 04/11/2024 0906   PLT 398.0 04/11/2024 0906   MCV 83.3 04/11/2024 0906   MCH 27.3 03/13/2024 1335   MCHC 32.3 04/11/2024 0906   RDW 14.8 04/11/2024 0906   LYMPHSABS 1.1 04/11/2024 0906   MONOABS 0.4 04/11/2024 0906   EOSABS 0.4 04/11/2024 0906   BASOSABS 0.0 04/11/2024 0906    Assessment & Plan:   Discussion: 63 year old female never smoker with asthma, HTN, DM2, hx CSF shunt in 1999 who presents for follow-up. Discussed clinical course and management of asthma including bronchodilator regimen, preventive care including vaccinations (declined influenza and covid) and action plan for exacerbation.   Mild persistent asthma - Well controlled on PRN ICS/LABA --CONTINUE Advair HFA 230-21 mcg TWO puffs in the morning and evening AS NEEDED.   >Please contact us  once you need refills. May need to switch inhalers due to insurance --Hold on allergy medication. Discontinue singulair  Asthma Action Plan Use albuterol  as needed for worsening shortness of breath, wheezing and cough. If you symptoms do not improve in 24-48 hours, please our office for evaluation  and/or prednisone  taper.  Co-comitant restrictive defect - likely effort related. Normal DLCO --Chest imaging and in-clinic O2 normal --Possible restrictive defect through suspect this may be related to effort +/- obstruction from asthma.  --Suspect effort related. Will recheck PFTs in 6 months  --ORDER pulmonary function test for next visit  Nasal congestion --CONTINUE atrovent  nasal spray. REFILLED today --OK to use saline rinses or Neti pot --NO afrin  Health Maintenance Immunization History  Administered Date(s) Administered   PFIZER Comirnaty(Gray Top)Covid-19 Tri-Sucrose Vaccine 06/22/2020, 07/13/2020   PNEUMOCOCCAL CONJUGATE-20 11/19/2022   Tdap 12/16/2022   Zoster Recombinant(Shingrix) 03/22/2021, 06/14/2021    Orders Placed This Encounter  Procedures   Pulmonary function test    Standing Status:   Future    Expiration Date:   08/25/2025    Where should this test be performed?:   Outpatient Pulmonary    What type of PFT is being ordered?:   Full PFT   Meds ordered this encounter  Medications   ipratropium (ATROVENT ) 0.06 % nasal spray    Sig: Place 2 sprays into both nostrils 4 (four) times daily.    Dispense:  15 mL    Refill:  1    Return in about 6 months (around 02/22/2025) for after PFT with me.  I have spent a total time of 31-minutes on the day of the appointment including chart review, data review, collecting history, coordinating care and discussing medical diagnosis and plan with the patient/family. Past medical history, allergies, medications were reviewed. Pertinent imaging, labs and tests included in this note have been reviewed and interpreted independently by me.  Cheryl Huang Staff, MD  Pulmonary Critical Care 08/25/2024 9:43 AM

## 2024-08-25 NOTE — Patient Instructions (Addendum)
 Mild persistent asthma - well controlled on PRN ICS/LABA --CONTINUE Advair HFA 230-21 mcg TWO puffs in the morning and evening AS NEEDED.   >Please contact us  once you need refills. May need to switch inhalers due to insurance  Co-comitant restrictive defect - likely effort related.  --Suspect effort related. Will recheck PFTs in 6 months  --ORDER pulmonary function test for next visit  Nasal congestion --CONTINUE atrovent  nasal spray --OK to use saline rinses or Neti pot --NO afrin

## 2024-09-14 ENCOUNTER — Other Ambulatory Visit: Payer: Self-pay | Admitting: Internal Medicine

## 2024-09-14 NOTE — Telephone Encounter (Signed)
 Copied from CRM [id number]. Topic: Clinical - Medication Refill >> Sep 14, 2024 10:09 AM Shereese L wrote: Medication: metFORMIN  (GLUCOPHAGE ) 500 MG tablet atorvastatin (LIPITOR) 20 MG tablet  Has the patient contacted their pharmacy? Yes (Agent: If no, request that the patient contact the pharmacy for the refill. If patient does not wish to contact the pharmacy document the reason why and proceed with request.) (Agent: If yes, when and what did the pharmacy advise?)  This is the patient's preferred pharmacy:  [facility] - [place], [place] - [address] [place] [address] Phone: [phone] Fax: [phone]  Is this the correct pharmacy for this prescription? Yes If no, delete pharmacy and type the correct one.   Has the prescription been filled recently? Yes  Is the patient out of the medication? Yes  Has the patient been seen for an appointment in the last year OR does the patient have an upcoming appointment? Yes  Can we respond through MyChart? Yes  Agent: Please be advised that Rx refills may take up to 3 business days. We ask that you follow-up with your pharmacy.

## 2024-09-15 NOTE — Telephone Encounter (Signed)
 Refill request for  Atorvastatin LR hx provider LOV  08/10/24 FOV 12/11/24  Please review and advise.  Thanks. Dm/cma

## 2024-09-18 MED ORDER — ATORVASTATIN CALCIUM 20 MG PO TABS: 20.0000 mg | ORAL_TABLET | Freq: Every day | ORAL | 2 refills | Status: AC

## 2024-12-11 ENCOUNTER — Ambulatory Visit: Admitting: Internal Medicine

## 2024-12-21 ENCOUNTER — Ambulatory Visit: Admitting: Internal Medicine

## 2024-12-21 ENCOUNTER — Ambulatory Visit: Payer: Self-pay | Admitting: Internal Medicine

## 2024-12-21 ENCOUNTER — Encounter: Payer: Self-pay | Admitting: Internal Medicine

## 2024-12-21 VITALS — BP 148/72 | HR 77 | Temp 98.0°F | Ht 61.0 in | Wt 150.4 lb

## 2024-12-21 DIAGNOSIS — E1159 Type 2 diabetes mellitus with other circulatory complications: Secondary | ICD-10-CM

## 2024-12-21 DIAGNOSIS — R0982 Postnasal drip: Secondary | ICD-10-CM | POA: Insufficient documentation

## 2024-12-21 DIAGNOSIS — D509 Iron deficiency anemia, unspecified: Secondary | ICD-10-CM | POA: Diagnosis not present

## 2024-12-21 DIAGNOSIS — H30033 Focal chorioretinal inflammation, peripheral, bilateral: Secondary | ICD-10-CM

## 2024-12-21 DIAGNOSIS — E1169 Type 2 diabetes mellitus with other specified complication: Secondary | ICD-10-CM

## 2024-12-21 DIAGNOSIS — E118 Type 2 diabetes mellitus with unspecified complications: Secondary | ICD-10-CM | POA: Diagnosis not present

## 2024-12-21 DIAGNOSIS — I152 Hypertension secondary to endocrine disorders: Secondary | ICD-10-CM | POA: Diagnosis not present

## 2024-12-21 DIAGNOSIS — E785 Hyperlipidemia, unspecified: Secondary | ICD-10-CM | POA: Diagnosis not present

## 2024-12-21 DIAGNOSIS — Z7984 Long term (current) use of oral hypoglycemic drugs: Secondary | ICD-10-CM

## 2024-12-21 DIAGNOSIS — E559 Vitamin D deficiency, unspecified: Secondary | ICD-10-CM | POA: Diagnosis not present

## 2024-12-21 DIAGNOSIS — R809 Proteinuria, unspecified: Secondary | ICD-10-CM

## 2024-12-21 LAB — BASIC METABOLIC PANEL WITH GFR
BUN: 12 mg/dL (ref 6–23)
CO2: 28 meq/L (ref 19–32)
Calcium: 10 mg/dL (ref 8.4–10.5)
Chloride: 101 meq/L (ref 96–112)
Creatinine, Ser: 0.76 mg/dL (ref 0.40–1.20)
GFR: 81.96 mL/min
Glucose, Bld: 122 mg/dL — ABNORMAL HIGH (ref 70–99)
Potassium: 3.9 meq/L (ref 3.5–5.1)
Sodium: 138 meq/L (ref 135–145)

## 2024-12-21 LAB — HEMOGLOBIN A1C: Hgb A1c MFr Bld: 6.8 % — ABNORMAL HIGH (ref 4.6–6.5)

## 2024-12-21 MED ORDER — LOSARTAN POTASSIUM 100 MG PO TABS: 100.0000 mg | ORAL_TABLET | Freq: Every day | ORAL | 3 refills | Status: AC

## 2024-12-21 MED ORDER — GLIPIZIDE ER 5 MG PO TB24: 5.0000 mg | ORAL_TABLET | Freq: Every day | ORAL | 3 refills | Status: AC

## 2024-12-21 NOTE — Progress Notes (Signed)
 " [facility] [address] [place] [address] Dept: [phone] Dept Fax: [phone]    Subjective:   Cheryl Huang Apr 10, 1961 12/21/2024  Chief Complaint  Patient presents with   Follow-up    Patient states she is having difficulty with mucus (yellow) and cough, the pills make her drowsy. Otherwise feels like the medication is working. Other doctor prescribed pantoprazole  for stomach issues and makes her feel nauseous.    HPI: Cheryl Huang presents today for re-assessment and management of chronic medical conditions.  Discussed the use of AI scribe software for clinical note transcription with the patient, who gave verbal consent to proceed.  History of Present Illness   Cheryl Huang is a 64 year old female with type 2 diabetes mellitus, hypertension, and hyperlipidemia who presents for a routine follow-up visit.  She has type 2 diabetes mellitus, managed with glipizide  XL 5 mg once daily and metformin  500 mg once daily. Her diabetes management includes regular monitoring of A1c levels. She has a history of microalbuminuria and was referred to nephrology for further evaluation, including a renal ultrasound. She has upcoming appt with nephrology in March 2026. She is up to date on her diabetic eye and foot exams.  Her hypertension is managed with amlodipine  10 mg once daily, hydrochlorothiazide  12.5 mg once daily, losartan  100 mg once daily, and metoprolol  50 mg once daily. She regularly monitors her blood pressure and reports it to her nephrologist.  Hyperlipidemia is managed with atorvastatin  20 mg once daily. She is up to date on her cholesterol checks, with the last one being in August 2025.  She has a history of iron deficiency anemia and takes an iron supplement daily. Additionally, she has vitamin D  deficiency and takes a vitamin D  supplement.  She experiences ongoing issues with mucus and postnasal  drip. She uses Flonase nasal spray and Atrovent  nasal spray, and has taken  loratadine  at daytime, but this causes some drowsiness. She is followed by pulmonology for asthma. Pulm manages atrovent  spray.    She is taking hydroxyzine for OAB, which causes drowsiness. She has been taking this during the daytime instead of night time. She is also taking the myrbetriq.     She is managing a Peripheral focal chorioretinal inflammation of both eyes with azathioprine, as prescribed by her ophthalmologist.       The following portions of the patient's history were reviewed and updated as appropriate: past medical history, past surgical history, family history, social history, allergies, medications, and problem list.   Patient Active Problem List   Diagnosis Date Noted   Controlled diabetes mellitus type 2 with complications (HCC) 12/21/2024   Microalbuminuria 04/14/2024   Acid reflux 04/11/2024   Nuclear sclerotic cataract of both eyes 10/26/2023   Epiretinal membrane (ERM) of both eyes 10/26/2023   Peripheral focal chorioretinal inflammation of both eyes 10/26/2023   Retinal edema 10/26/2023   Osteoarthritis of lumbar spine 10/04/2023   Primary osteoarthritis of right hip 10/04/2023   S/P total left hip arthroplasty 07/13/2023   Iron deficiency anemia 05/17/2023   Non-seasonal allergic rhinitis 05/17/2023   Vitamin D  deficiency 05/17/2023   Restrictive lung disease 03/17/2023   Hypertension associated with type 2 diabetes mellitus (HCC) 05/20/2022   OAB (overactive bladder) 05/20/2022   Type 2 diabetes mellitus without complication, without long-term current use of insulin  (HCC) 05/20/2022   Hyperlipidemia associated with type 2 diabetes mellitus (HCC) 05/20/2022   Lactose intolerance, unspecified 05/20/2022  Mild persistent asthma without complication 04/16/2022   Hyperparathyroidism, primary 08/14/2020   Pap smear of cervix shows high risk HPV present 06/30/2018   Past Medical  History:  Diagnosis Date   Anemia    Asthma    Complication of anesthesia    Diabetes mellitus without complication (HCC)    Hyperlipidemia    Hypertension    Past Surgical History:  Procedure Laterality Date   CSF SHUNT     states had shunt for her brain 17 yrs ago   PARATHYROIDECTOMY Right 08/15/2020   Procedure: RIGHT PARATHYROIDECTOMY;  Surgeon: Eletha Boas, MD;  Location: [facility];  Service: General;  Laterality: Right;   TOTAL HIP ARTHROPLASTY Left 07/13/2023   Procedure: TOTAL HIP ARTHROPLASTY ANTERIOR APPROACH;  Surgeon: Beverley Evalene BIRCH, MD;  Location: [facility];  Service: Orthopedics;  Laterality: Left;   Family History  Problem Relation Age of Onset   Asthma Other    Current Medications[1] Allergies[2]   ROS: A complete ROS was performed with pertinent positives/negatives noted in the HPI. The remainder of the ROS are negative.    Objective:   Today's Vitals   12/21/24 1016  BP: (!) 148/75  Pulse: 77  Temp: 98 F (36.7 C)  SpO2: 100%  Weight: 150 lb 6.4 oz (68.2 kg)  Height: 5' 1 (1.549 m)  PainSc: 0-No pain    GENERAL: Well-appearing, in NAD. Well nourished.  SKIN: Pink, warm and dry. No rash, lesion, ulceration, or ecchymoses.  NECK: Trachea midline. Full ROM w/o pain or tenderness. No lymphadenopathy. No thyromegaly or palpable masses.  RESPIRATORY: Chest wall symmetrical. Respirations even and non-labored. Breath sounds clear to auscultation bilaterally.  CARDIAC: S1, S2 present, regular rate and rhythm. Peripheral pulses 2+ bilaterally.  MSK: Muscle tone and strength appropriate for age.  EXTREMITIES: Without clubbing, cyanosis, or edema.  NEUROLOGIC: Steady, even gait.  PSYCH/MENTAL STATUS: Alert, oriented x 3. Cooperative, appropriate mood and affect.   Health Maintenance Due  Topic Date Due   Medicare Annual Wellness (AWV)  Never done   COVID-19 Vaccine (3 - Pfizer risk series) 08/10/2020   Bone Density Scan  Never done    No results found for  any visits on 12/21/24.  The 10-year ASCVD risk score (Arnett DK, et al., 2019) is: 25.3%     Assessment & Plan:  Assessment and Plan    Type 2 diabetes mellitus with microalbuminuria and hyperlipidemia Type 2 diabetes with microalbuminuria and hyperlipidemia managed with glipizide  XL, metformin , and atorvastatin . Nephrology overseeing microalbuminuria with renal ultrasound planned. - Checked A1c today. - Continue glipizide  XL 5 mg once daily. - Continue metformin  500 mg once daily. - Continue atorvastatin  20 mg once daily. - Will obtain nephrology records for microalbuminuria management.  Hypertension with Type 2 Diabetes Hypertension managed with amlodipine , hydrochlorothiazide , losartan , and metoprolol . Blood pressure slightly elevated. Monitoring kidney function.  Potential metoprolol  discontinuation if blood pressure remains controlled. - Continue amlodipine  10 mg once daily. - Continue hydrochlorothiazide  12.5 mg once daily. - Continue losartan  100 mg once daily. - Continue metoprolol  50 mg once daily. - Monitor kidney function. - Will discuss potential medication adjustments with nephrologist. - Continue follow up with nephrology.  Iron deficiency anemia Iron deficiency anemia managed with daily iron supplementation. - Continue iron supplement once daily.  Vitamin D  deficiency Vitamin D  deficiency managed with daily supplementation. - Continue vitamin D  supplement.  Postnasal drip Chronic rhinosinusitis with postnasal drip causing nocturnal cough managed with Flonase and Atrovent  nasal sprays. Loratadine  causes drowsiness.  Hydroxyzine used as needed for bladder symptoms, also an antihistamine. - Continue Flonase nasal spray. - Continue Atrovent  nasal spray. - STOP loratadine .  - Take hydroxyzine at nighttime as needed for bladder symptoms, this may also improve congestion due to antihistamine effects.   OAB  - Continue myrbetriq and hydroxyzine as prescribed by OBGYN   - Advised hydroxyzine to be taken at bedtime due to sedating effect   Peripheral focal chorioretinal inflammation of both eyes  - continue follow up with eye specialist  - continue azathioprine as prescribed by opthalmology.   General Health Maintenance Up to date on mammogram, diabetic eye exam, colonoscopy, Pap smears, and foot exam.      Orders Placed This Encounter  Procedures   Hemoglobin A1C   Basic Metabolic Panel (BMET)   No images are attached to the encounter or orders placed in the encounter. Meds ordered this encounter  Medications   glipiZIDE  (GLUCOTROL  XL) 5 MG 24 hr tablet    Sig: Take 1 tablet (5 mg total) by mouth daily with breakfast.    Dispense:  90 tablet    Refill:  3    Supervising Provider:   THOMPSON, AARON B [[id number]]   losartan  (COZAAR ) 100 MG tablet    Sig: Take 1 tablet (100 mg total) by mouth daily.    Dispense:  90 tablet    Refill:  3    Supervising Provider:   SEBASTIAN BEVERLEY NOVAK [[id number]]    Return in about 6 months (around 06/20/2025) for Chronic Condition follow up.   Rosina Senters, FNP     [1]  Current Outpatient Medications:    acetaminophen  (TYLENOL ) 500 MG tablet, Take 2 tablets (1,000 mg total) by mouth every 6 (six) hours as needed for mild pain or moderate pain., Disp: 60 tablet, Rfl: 0   amLODipine  (NORVASC ) 5 MG tablet, Take 2 tablets (10 mg total) by mouth daily., Disp: 180 tablet, Rfl: 2   ascorbic acid (VITAMIN C) 500 MG tablet, Take 500 mg by mouth daily., Disp: , Rfl:    aspirin  EC 81 MG tablet, Take 1 tablet (81 mg total) by mouth 2 (two) times daily. To prevent blood clots for 30 days after surgery., Disp: 60 tablet, Rfl: 0   atorvastatin  (LIPITOR) 20 MG tablet, Take 1 tablet (20 mg total) by mouth daily., Disp: 90 tablet, Rfl: 2   azaTHIOprine (IMURAN) 50 MG tablet, Take 50 mg by mouth daily., Disp: , Rfl:    Cholecalciferol (D3 ADULT PO), Take 1,000 Int'l Units by mouth daily., Disp: , Rfl:    dexamethasone  (DECADRON ) 4  MG/ML injection, Inject 4 mg into the articular space., Disp: , Rfl:    ferrous sulfate  325 (65 FE) MG tablet, Take 1 tablet (325 mg total) by mouth daily with breakfast., Disp: 90 tablet, Rfl: 1   fluticasone  (FLONASE) 50 MCG/ACT nasal spray, Place 2 sprays into both nostrils in the morning and at bedtime. (Patient taking differently: Place 2 sprays into both nostrils as needed for rhinitis.), Disp: , Rfl:    fluticasone -salmeterol (ADVAIR HFA) 230-21 MCG/ACT inhaler, Inhale 2 puffs into the lungs 2 (two) times daily., Disp: 36 g, Rfl: 2   hydrochlorothiazide  (HYDRODIURIL ) 12.5 MG tablet, Take 1 tablet (12.5 mg total) by mouth daily., Disp: 90 tablet, Rfl: 3   hydrOXYzine (ATARAX) 10 MG tablet, Take 10 mg by mouth at bedtime as needed., Disp: , Rfl:    ipratropium (ATROVENT ) 0.06 % nasal spray, Place 2 sprays into both nostrils 4 (four)  times daily., Disp: 15 mL, Rfl: 1   metFORMIN  (GLUCOPHAGE ) 500 MG tablet, Take 1 tablet (500 mg total) by mouth daily., Disp: 90 tablet, Rfl: 3   metoprolol  succinate (TOPROL -XL) 50 MG 24 hr tablet, Take 1 tablet (50 mg total) by mouth daily. Take with or immediately following a meal., Disp: 90 tablet, Rfl: 3   MYRBETRIQ 50 MG TB24 tablet, Take 50 mg by mouth daily., Disp: , Rfl:    pantoprazole  (PROTONIX ) 40 MG tablet, Take 1 tablet (40 mg total) by mouth daily., Disp: 90 tablet, Rfl: 3   glipiZIDE  (GLUCOTROL  XL) 5 MG 24 hr tablet, Take 1 tablet (5 mg total) by mouth daily with breakfast., Disp: 90 tablet, Rfl: 3   losartan  (COZAAR ) 100 MG tablet, Take 1 tablet (100 mg total) by mouth daily., Disp: 90 tablet, Rfl: 3 [2]  Allergies Allergen Reactions   Penicillins Rash   "

## 2024-12-21 NOTE — Progress Notes (Signed)
 Hi Cheryl Huang, Your diabetes remains well-controlled with your A1c of 6.8.  Also, your electrolytes and kidney function looks great.  Please continue your current blood pressure and diabetic medications as prescribed.  It was great to see you today!  Rosina

## 2025-02-20 ENCOUNTER — Encounter (HOSPITAL_BASED_OUTPATIENT_CLINIC_OR_DEPARTMENT_OTHER)

## 2025-02-20 ENCOUNTER — Ambulatory Visit (HOSPITAL_BASED_OUTPATIENT_CLINIC_OR_DEPARTMENT_OTHER): Admitting: Pulmonary Disease

## 2025-02-21 ENCOUNTER — Encounter (HOSPITAL_BASED_OUTPATIENT_CLINIC_OR_DEPARTMENT_OTHER)

## 2025-02-21 ENCOUNTER — Ambulatory Visit (HOSPITAL_BASED_OUTPATIENT_CLINIC_OR_DEPARTMENT_OTHER): Admitting: Pulmonary Disease

## 2025-06-20 ENCOUNTER — Ambulatory Visit: Admitting: Internal Medicine
